# Patient Record
Sex: Female | Born: 1975 | Race: Black or African American | Hispanic: No | Smoking: Never smoker
Health system: Southern US, Community
[De-identification: ages and names within clinical notes are randomized; demographics above are authoritative.]

## PROBLEM LIST (undated history)

## (undated) DIAGNOSIS — E7800 Pure hypercholesterolemia, unspecified: Secondary | ICD-10-CM

## (undated) DIAGNOSIS — F419 Anxiety disorder, unspecified: Secondary | ICD-10-CM

## (undated) DIAGNOSIS — I10 Essential (primary) hypertension: Secondary | ICD-10-CM

## (undated) DIAGNOSIS — F32A Depression, unspecified: Secondary | ICD-10-CM

## (undated) HISTORY — PX: BOWEL RESECTION: SHX1257

## (undated) HISTORY — PX: NECK SURGERY: SHX720

## (undated) HISTORY — DX: Depression, unspecified: F32.A

---

## 2003-02-07 ENCOUNTER — Ambulatory Visit (HOSPITAL_COMMUNITY): Admission: RE | Admit: 2003-02-07 | Discharge: 2003-02-07 | Payer: Self-pay | Admitting: Neurosurgery

## 2003-03-14 ENCOUNTER — Inpatient Hospital Stay (HOSPITAL_COMMUNITY): Admission: RE | Admit: 2003-03-14 | Discharge: 2003-03-15 | Payer: Self-pay | Admitting: Neurosurgery

## 2004-03-18 ENCOUNTER — Emergency Department: Payer: Self-pay | Admitting: Internal Medicine

## 2005-03-30 ENCOUNTER — Emergency Department: Payer: Self-pay | Admitting: Emergency Medicine

## 2005-04-03 ENCOUNTER — Emergency Department: Payer: Self-pay | Admitting: Unknown Physician Specialty

## 2005-04-08 ENCOUNTER — Emergency Department: Payer: Self-pay | Admitting: Emergency Medicine

## 2005-04-25 ENCOUNTER — Emergency Department: Payer: Self-pay | Admitting: Emergency Medicine

## 2005-12-15 ENCOUNTER — Ambulatory Visit: Payer: Self-pay | Admitting: Orthopedic Surgery

## 2006-04-18 ENCOUNTER — Emergency Department: Payer: Self-pay | Admitting: Emergency Medicine

## 2006-08-21 ENCOUNTER — Emergency Department: Payer: Self-pay | Admitting: Emergency Medicine

## 2006-11-17 ENCOUNTER — Ambulatory Visit: Payer: Self-pay | Admitting: Neurological Surgery

## 2007-11-24 ENCOUNTER — Emergency Department: Payer: Self-pay | Admitting: Emergency Medicine

## 2007-11-30 ENCOUNTER — Emergency Department: Payer: Self-pay | Admitting: Emergency Medicine

## 2008-09-21 ENCOUNTER — Emergency Department: Payer: Self-pay | Admitting: Emergency Medicine

## 2008-11-21 ENCOUNTER — Ambulatory Visit: Payer: Self-pay | Admitting: Neurosurgery

## 2008-12-11 ENCOUNTER — Ambulatory Visit: Payer: Self-pay | Admitting: Family Medicine

## 2010-04-28 ENCOUNTER — Ambulatory Visit: Payer: Self-pay | Admitting: Obstetrics and Gynecology

## 2010-05-03 ENCOUNTER — Inpatient Hospital Stay: Payer: Self-pay | Admitting: Obstetrics and Gynecology

## 2011-03-29 ENCOUNTER — Emergency Department: Payer: Self-pay | Admitting: Emergency Medicine

## 2011-04-14 ENCOUNTER — Emergency Department: Payer: Self-pay | Admitting: Emergency Medicine

## 2011-04-14 LAB — URINALYSIS, COMPLETE
Glucose,UR: NEGATIVE mg/dL (ref 0–75)
Ketone: NEGATIVE
Nitrite: POSITIVE
RBC,UR: 15010 /HPF (ref 0–5)
Specific Gravity: 1.027 (ref 1.003–1.030)
Squamous Epithelial: 4
WBC UR: 565 /HPF (ref 0–5)

## 2011-05-25 ENCOUNTER — Emergency Department: Payer: Self-pay | Admitting: Emergency Medicine

## 2011-05-25 LAB — RAPID INFLUENZA A&B ANTIGENS

## 2011-05-28 LAB — BETA STREP CULTURE(ARMC)

## 2011-09-22 ENCOUNTER — Emergency Department: Payer: Self-pay | Admitting: Emergency Medicine

## 2011-09-23 LAB — COMPREHENSIVE METABOLIC PANEL
Albumin: 3.3 g/dL — ABNORMAL LOW (ref 3.4–5.0)
BUN: 13 mg/dL (ref 7–18)
Glucose: 85 mg/dL (ref 65–99)
Potassium: 3.8 mmol/L (ref 3.5–5.1)
SGOT(AST): 13 U/L — ABNORMAL LOW (ref 15–37)
SGPT (ALT): 15 U/L (ref 12–78)
Total Protein: 6.9 g/dL (ref 6.4–8.2)

## 2011-09-23 LAB — URINALYSIS, COMPLETE
Leukocyte Esterase: NEGATIVE
Nitrite: NEGATIVE
Ph: 6 (ref 4.5–8.0)
Protein: NEGATIVE
WBC UR: 1 /HPF (ref 0–5)

## 2011-09-23 LAB — CBC WITH DIFFERENTIAL/PLATELET
Basophil %: 2.2 %
Eosinophil #: 0.2 10*3/uL (ref 0.0–0.7)
HGB: 12 g/dL (ref 12.0–16.0)
Lymphocyte #: 2.2 10*3/uL (ref 1.0–3.6)
MCHC: 33 g/dL (ref 32.0–36.0)
Monocyte #: 0.5 x10 3/mm (ref 0.2–0.9)
Neutrophil #: 2 10*3/uL (ref 1.4–6.5)
Platelet: 218 10*3/uL (ref 150–440)
RBC: 4.23 10*6/uL (ref 3.80–5.20)

## 2011-09-27 ENCOUNTER — Ambulatory Visit: Payer: Self-pay | Admitting: Obstetrics and Gynecology

## 2011-09-27 LAB — BASIC METABOLIC PANEL
Anion Gap: 8 (ref 7–16)
Calcium, Total: 8.8 mg/dL (ref 8.5–10.1)
Co2: 25 mmol/L (ref 21–32)
Creatinine: 0.82 mg/dL (ref 0.60–1.30)
EGFR (African American): >60
EGFR (Non-African Amer.): >60
Glucose: 76 mg/dL (ref 65–99)
Sodium: 141 mmol/L (ref 136–145)

## 2011-10-01 ENCOUNTER — Inpatient Hospital Stay: Payer: Self-pay | Admitting: Obstetrics and Gynecology

## 2011-10-02 LAB — BASIC METABOLIC PANEL
Calcium, Total: 8.3 mg/dL — ABNORMAL LOW (ref 8.5–10.1)
Co2: 27 mmol/L (ref 21–32)
Glucose: 103 mg/dL — ABNORMAL HIGH (ref 65–99)
Osmolality: 277 (ref 275–301)
Potassium: 3.7 mmol/L (ref 3.5–5.1)
Sodium: 140 mmol/L (ref 136–145)

## 2011-10-03 LAB — CBC WITH DIFFERENTIAL/PLATELET
Basophil %: 0.4 %
Eosinophil #: 0.4 10*3/uL (ref 0.0–0.7)
Eosinophil %: 6.8 %
HCT: 30.5 % — ABNORMAL LOW (ref 35.0–47.0)
HGB: 10.2 g/dL — ABNORMAL LOW (ref 12.0–16.0)
Lymphocyte #: 1.1 10*3/uL (ref 1.0–3.6)
MCH: 28.6 pg (ref 26.0–34.0)
MCHC: 33.5 g/dL (ref 32.0–36.0)
MCV: 85 fL (ref 80–100)
Monocyte #: 0.6 x10 3/mm (ref 0.2–0.9)
Neutrophil #: 3.3 10*3/uL (ref 1.4–6.5)
RBC: 3.58 10*6/uL — ABNORMAL LOW (ref 3.80–5.20)

## 2011-10-03 LAB — URINALYSIS, COMPLETE
Glucose,UR: NEGATIVE mg/dL (ref 0–75)
Leukocyte Esterase: NEGATIVE
Nitrite: NEGATIVE
Ph: 8 (ref 4.5–8.0)
Protein: NEGATIVE
Specific Gravity: 1.005 (ref 1.003–1.030)

## 2011-10-04 LAB — CBC WITH DIFFERENTIAL/PLATELET
Basophil #: 0 10*3/uL (ref 0.0–0.1)
Basophil %: 0.4 %
Eosinophil #: 0.5 10*3/uL (ref 0.0–0.7)
HCT: 30.3 % — ABNORMAL LOW (ref 35.0–47.0)
HGB: 9.9 g/dL — ABNORMAL LOW (ref 12.0–16.0)
Lymphocyte %: 27.4 %
MCH: 28 pg (ref 26.0–34.0)
MCHC: 32.6 g/dL (ref 32.0–36.0)
Monocyte #: 0.6 x10 3/mm (ref 0.2–0.9)
Neutrophil #: 2.6 10*3/uL (ref 1.4–6.5)
Neutrophil %: 50.6 %
RDW: 13.6 % (ref 11.5–14.5)

## 2011-10-05 LAB — CBC WITH DIFFERENTIAL/PLATELET
Basophil #: 0 10*3/uL (ref 0.0–0.1)
Basophil %: 0.6 %
HGB: 9.8 g/dL — ABNORMAL LOW (ref 12.0–16.0)
Lymphocyte %: 31 %
MCH: 27.7 pg (ref 26.0–34.0)
MCHC: 32.2 g/dL (ref 32.0–36.0)
Monocyte %: 11.4 %
Neutrophil #: 2.1 10*3/uL (ref 1.4–6.5)
Neutrophil %: 43.8 %

## 2012-10-16 ENCOUNTER — Emergency Department: Payer: Self-pay | Admitting: Emergency Medicine

## 2013-04-03 ENCOUNTER — Emergency Department: Payer: Self-pay | Admitting: Emergency Medicine

## 2013-04-06 LAB — BETA STREP CULTURE(ARMC)

## 2013-09-08 ENCOUNTER — Emergency Department: Payer: Self-pay | Admitting: Emergency Medicine

## 2013-09-08 LAB — COMPREHENSIVE METABOLIC PANEL
ALT: 26 U/L
ANION GAP: 8 (ref 7–16)
Albumin: 3.5 g/dL (ref 3.4–5.0)
Alkaline Phosphatase: 29 U/L — ABNORMAL LOW
BILIRUBIN TOTAL: 0.3 mg/dL (ref 0.2–1.0)
BUN: 10 mg/dL (ref 7–18)
CALCIUM: 9 mg/dL (ref 8.5–10.1)
CO2: 25 mmol/L (ref 21–32)
Chloride: 106 mmol/L (ref 98–107)
Creatinine: 0.78 mg/dL (ref 0.60–1.30)
EGFR (African American): >60
Glucose: 91 mg/dL (ref 65–99)
Osmolality: 276 (ref 275–301)
Potassium: 3.9 mmol/L (ref 3.5–5.1)
SGOT(AST): 14 U/L — ABNORMAL LOW (ref 15–37)
SODIUM: 139 mmol/L (ref 136–145)
Total Protein: 7.3 g/dL (ref 6.4–8.2)

## 2013-09-08 LAB — CBC WITH DIFFERENTIAL/PLATELET
Basophil #: 0 10*3/uL (ref 0.0–0.1)
Basophil %: 0.5 %
EOS ABS: 0.3 10*3/uL (ref 0.0–0.7)
EOS PCT: 4.6 %
HCT: 37.5 % (ref 35.0–47.0)
HGB: 12.2 g/dL (ref 12.0–16.0)
Lymphocyte #: 1.9 10*3/uL (ref 1.0–3.6)
Lymphocyte %: 34.6 %
MCH: 28.1 pg (ref 26.0–34.0)
MCHC: 32.5 g/dL (ref 32.0–36.0)
MCV: 86 fL (ref 80–100)
MONO ABS: 0.5 x10 3/mm (ref 0.2–0.9)
Monocyte %: 8.5 %
Neutrophil #: 2.8 10*3/uL (ref 1.4–6.5)
Neutrophil %: 51.8 %
PLATELETS: 228 10*3/uL (ref 150–440)
RBC: 4.35 10*6/uL (ref 3.80–5.20)
RDW: 13.2 % (ref 11.5–14.5)
WBC: 5.4 10*3/uL (ref 3.6–11.0)

## 2013-09-08 LAB — URINALYSIS, COMPLETE
Bacteria: NONE SEEN
Bilirubin,UR: NEGATIVE
Blood: NEGATIVE
GLUCOSE, UR: NEGATIVE mg/dL (ref 0–75)
KETONE: NEGATIVE
LEUKOCYTE ESTERASE: NEGATIVE
Nitrite: POSITIVE
Ph: 6 (ref 4.5–8.0)
Protein: NEGATIVE
RBC,UR: 1 /HPF (ref 0–5)
Specific Gravity: 1.015 (ref 1.003–1.030)
WBC UR: 4 /HPF (ref 0–5)

## 2013-09-08 LAB — LIPASE, BLOOD: LIPASE: 96 U/L (ref 73–393)

## 2014-04-30 NOTE — Discharge Summary (Signed)
PATIENT NAME:  Lacey King, Lacey King MR#:  [id number] DATE OF BIRTH:  February 27, 1975  DATE OF ADMISSION:  10/01/2011 DATE OF DISCHARGE:  10/05/2011  PRINCIPLE PROCEDURE: Exploratory laparotomy, lysis of adhesions.   HOSPITAL COURSE: The patient underwent the above procedure without complication. Postoperatively the patient had slow return of bowel function. Postop day #3 she was advanced to a regular diet. The patient had intermittent fevers postoperatively initially thought to be due to atelectasis. She was placed on gentamicin and Cleocin and remained afebrile for the last 36 hours before discharge. The patient was passing flatus. Incision was clean, dry, and intact. Vital signs stable on discharge. She was discharged to home in good condition. She will follow-up with Dr. Feliberto GottronSchermerhorn in two weeks or before if she has wound drainage, fever, nausea, vomiting, or increasing abdominal pain. Staples were removed before discharge and Steri-Strips applied.   DISCHARGE MEDICATIONS:  1. Percocet 5/325 mg 1 to 2 tablets every 4 to 6 hours. 2. Naprosyn 500 mg q.12 hours as needed for pain.  3. Mylicon 80 mg q.a.c. and at bedtime. 4. Colace 100 mg 1 tablet daily.   ____________________________ Suzy Bouchardhomas J. Broden Holt, MD tjs:drc D: 10/05/2011 09:10:55 ET T: 10/06/2011 16:25:43 ET JOB#: [id number]  cc: Suzy Bouchardhomas J. Shaunee Mulkern, MD, <Dictator> Suzy BouchardHOMAS J Tasmin Exantus MD ELECTRONICALLY SIGNED 10/09/2011 9:42

## 2014-04-30 NOTE — Op Note (Signed)
PATIENT NAME:  [name], Lacey King#:  [id number] DATE OF BIRTH:  [birth year]  DATE OF PROCEDURE:  10/01/2011  PREOPERATIVE DIAGNOSIS: Pelvic fluid-filled mass with associated pain.   POSTOPERATIVE DIAGNOSIS: Pelvic fluid collection with extensive intra-abdominal adhesions.   PROCEDURE: Enterolysis.   SURGEON:  Adella HareJ. Wilton Muhammad Vacca, MD.   ANESTHESIA: General.   INDICATIONS: This 39 year old female with previous hysterectomy was admitted with pelvic pain and CT findings of a fluid-filled pelvic mass. The patient was undergoing surgery by Dr. Feliberto GottronSchermerhorn and Dr. Logan BoresEvans. There were multiple intra-abdominal adhesions found and what appeared to be a large cystic structure, however, the sigmoid colon was attached to this and also a portion of the small bowel. Dr. Feliberto GottronSchermerhorn called for a general surgery consultation to do some of the dissection to mobilize the colon and small bowel off the cystic mass to allow better exploration of the pelvis.   DESCRIPTION OF PROCEDURE:  The abdomen was opened and I identified the large fluid collection which at the time it appeared that it may be a large cyst and there was concern about possibility of a neoplastic cyst. The sigmoid colon was attached to this and the colon itself was soft and pliable with no apparent mass in this portion of the colon. I then lysed multiple adhesions between the wall of the fluid collection and the sigmoid colon mobilizing a portion of the sigmoid colon which was approximately 6 inches in length off of this fluid-filled collection of tissue. Also, mobilized a portion of the ileum off of it with additional lysis of adhesions and separated the small bowel from the right ovary. The appendix was in the operative field and appeared normal. Further dissection was carried out as the wall of the fluid collection was peeled away from the mesentery of the sigmoid colon. This allowed further exposure. It is noted that the fluid collection then drained  and suction catheter was put into it and aspirated a large amount of fluid. This exposed the tubes and ovaries for Dr. Feliberto GottronSchermerhorn to examine.  Seeing that the sigmoid colon had been mobilized and ileum mobilized by lysis of adhesions, I scrubbed out of the operation for Dr. Feliberto GottronSchermerhorn and Dr. Logan BoresEvans to continue with pelvic surgery.  ____________________________ Shela CommonsJ. Renda RollsWilton Rayen Palen, MD jws:ap D: 10/01/2011 17:19:28 ET T: 10/02/2011 10:22:36 ET JOB#: [id number]  cc: Adella HareJ. Wilton Marquerite Forsman, MD, <Dictator> Adella HareWILTON J Alina Gilkey MD ELECTRONICALLY SIGNED 10/08/2011 9:45

## 2014-04-30 NOTE — Op Note (Signed)
PATIENT NAME:  Lacey King, [name] MR#:  [id number] DATE OF BIRTH:  Jan 30, 1975  DATE OF PROCEDURE:  10/01/2011  PREOPERATIVE DIAGNOSIS: Large left pelvic mass.    POSTOPERATIVE DIAGNOSES:  1. Peritoneal collection of fluid.  2. Extensive pelvic adhesions.   PROCEDURES:  1. Exploratory laparotomy. 2. Extensive adhesiolysis.  3. Intraoperative consultation, Dr. Renda RollsWilton Smith.  4. Retrograde bladder filling.   ANESTHESIA: General endotracheal anesthesia.   SURGEON: Suzy Bouchardhomas J. Breck [place], MD  FIRST ASSISTANT: Dr. Logan BoresEvans   INTRAOPERATIVE CONSULTANT: Dr. Renda RollsWilton Smith.   INDICATIONS: A 39 year old gravida 3, para 2 patient with a 14 x 12 cm left adnexal mass that was noted on CT scan and ultrasound. Patient is status post a total abdominal hysterectomy 14 months prior.   DESCRIPTION OF PROCEDURE: After adequate general endotracheal anesthesia, patient was placed in dorsal supine position. Abdomen, perineal, vagina was prepped with normal sterile fashion. A Foley catheter previously placed. 1 gram of IV Ancef used for prophylaxis. Pfannenstiel incision was made. Sharp dissection was used to identify the fascia. Fascia was opened in the midline and opened in a transverse fashion. There were some dense adhesions at the fascial and subfascial level. Ultimately peritoneal cavity was entered sharply. A large amount of omental adhesions were dissected free clamping, transecting, and tying with Vicryl. Ultimately, an O'Connor-O'Sullivan retractor was advanced into the abdominal cavity and the bowel was packed cephalad. A large amount of pelvic adhesions with bowel adhesed to the vaginal vault and sidewalls. Dissection took place. Ultimately the mass in question was identified and felt to be retroperitoneal with the sigmoid draped on top of this. At this point intraoperative consultation was requested with Dr. Renda RollsWilton Smith. Please reference Dr. Murriel HopperWilton Smith's separate operative report. Once Dr. Katrinka BlazingSmith came  in he scrubbed. The sigmoid colon was meticulously dissected off this large left lower mass. During the dissection of the sigmoid the cyst wall was entered. Straw-colored fluid resulted. The collection was drained. This loculation was opened and felt that there was no definitive mass other than just a loculation of peritoneal fluid. The left ovary and fallopian tube were on the left sidewall and appeared normal. There was some scarring to the left sidewall but there was no nodularity or papulations within this pseudocyst wall. Again, Dr. Renda RollsWilton Smith continued the dissection to free the sigmoid up completely and the small bowel that was adhesed to the right ovary and deep into the pelvis. Ultimately all tissues were freed. Given the dissection being close to the bladder, the bladder was retrograde filled with indigo carmine and saline. There was no leakage of the bladder. The foley was replaced.The patient's abdomen was then copiously irrigated. Good hemostasis was noted. The fascia was closed with 0 Vicryl suture in a running nonlocking fashion, good approximation of edgesThe subcutaneous tissues were irrigated and bovied for hemostasis and the skin was reapproximated with staples. Estimated blood loss 150 mL. Intraoperative fluids 1900 mL. Patient tolerated procedure well and was taken to recovery room in good condition.  ____________________________ Suzy Bouchardhomas J. Ran Tullis, MD tjs:cms D: 10/01/2011 13:00:46 ET T: 10/01/2011 13:32:39 ET JOB#: [id number]  cc: Suzy Bouchardhomas J. Eldrige Pitkin, MD, <Dictator> Suzy BouchardHOMAS J Broadus Costilla MD ELECTRONICALLY SIGNED 10/02/2011 10:12

## 2014-07-04 ENCOUNTER — Encounter: Payer: Self-pay | Admitting: *Deleted

## 2014-07-04 ENCOUNTER — Emergency Department
Admission: EM | Admit: 2014-07-04 | Discharge: 2014-07-04 | Disposition: A | Discharge status: Home / Self Care | Payer: Self-pay | Attending: Student | Admitting: Student

## 2014-07-04 ENCOUNTER — Other Ambulatory Visit: Payer: Self-pay

## 2014-07-04 DIAGNOSIS — I10 Essential (primary) hypertension: Secondary | ICD-10-CM

## 2014-07-04 DIAGNOSIS — Z79899 Other long term (current) drug therapy: Secondary | ICD-10-CM | POA: Insufficient documentation

## 2014-07-04 DIAGNOSIS — M542 Cervicalgia: Secondary | ICD-10-CM

## 2014-07-04 DIAGNOSIS — G8929 Other chronic pain: Secondary | ICD-10-CM | POA: Insufficient documentation

## 2014-07-04 HISTORY — DX: Essential (primary) hypertension: I10

## 2014-07-04 LAB — URINALYSIS COMPLETE WITH MICROSCOPIC (ARMC ONLY)
BILIRUBIN URINE: NEGATIVE
Bacteria, UA: NONE SEEN
Glucose, UA: NEGATIVE mg/dL
KETONES UR: NEGATIVE mg/dL
LEUKOCYTES UA: NEGATIVE
Nitrite: NEGATIVE
PH: 5 (ref 5.0–8.0)
PROTEIN: NEGATIVE mg/dL
Specific Gravity, Urine: 1.017 (ref 1.005–1.030)

## 2014-07-04 MED ORDER — KETOROLAC TROMETHAMINE 10 MG PO TABS: 10.0000 mg | ORAL_TABLET | Freq: Four times a day (QID) | ORAL | Status: DC | PRN

## 2014-07-04 MED ORDER — DIAZEPAM 5 MG PO TABS
ORAL_TABLET | ORAL | Status: AC
  Administered 2014-07-04: 2 mg via ORAL
  Filled 2014-07-04: qty 1

## 2014-07-04 MED ORDER — LISINOPRIL 20 MG PO TABS
20.0000 mg | ORAL_TABLET | Freq: Every day | ORAL | Status: AC
Start: 2014-07-04 — End: 2015-07-04

## 2014-07-04 MED ORDER — DIAZEPAM 2 MG PO TABS
2.0000 mg | ORAL_TABLET | Freq: Three times a day (TID) | ORAL | Status: AC | PRN
Start: 2014-07-04 — End: 2015-07-04

## 2014-07-04 MED ORDER — KETOROLAC TROMETHAMINE 60 MG/2ML IM SOLN
INTRAMUSCULAR | Status: AC
  Administered 2014-07-04: 60 mg via INTRAMUSCULAR
  Filled 2014-07-04: qty 2

## 2014-07-04 MED ORDER — CLONIDINE HCL 0.1 MG PO TABS
0.2000 mg | ORAL_TABLET | Freq: Once | ORAL | Status: AC
  Administered 2014-07-04: 0.2 mg via ORAL

## 2014-07-04 MED ORDER — DIAZEPAM 2 MG PO TABS
2.0000 mg | ORAL_TABLET | Freq: Once | ORAL | Status: AC
  Administered 2014-07-04: 2 mg via ORAL

## 2014-07-04 MED ORDER — CLONIDINE HCL 0.1 MG PO TABS
ORAL_TABLET | ORAL | Status: AC
  Administered 2014-07-04: 0.2 mg via ORAL
  Filled 2014-07-04: qty 2

## 2014-07-04 MED ORDER — KETOROLAC TROMETHAMINE 60 MG/2ML IM SOLN
60.0000 mg | Freq: Once | INTRAMUSCULAR | Status: AC
  Administered 2014-07-04: 60 mg via INTRAMUSCULAR
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
## 2014-07-04 NOTE — ED Notes (Signed)
Computer not working in room 46 where pt was. E-signature not done. Pt verbalized understanding of prescriptions and d/c instructions and had no further questions, needs, or concerns.

## 2014-07-04 NOTE — ED Notes (Signed)
Pt arrives with complaints of neck pain, pt has hx of neck surgery and pt states for the past week neck feels very sore

## 2014-07-04 NOTE — Discharge Instructions (Signed)
Cervical Sprain A cervical sprain is when the tissues (ligaments) that hold the neck bones in place stretch or tear. HOME CARE   Put ice on the injured area.  Put ice in a plastic bag.  Place a towel between your skin and the bag.  Leave the ice on for 15-20 minutes, 3-4 times a day.  You may have been given a collar to wear. This collar keeps your neck from moving while you heal.  Do not take the collar off unless told by your doctor.  If you have long hair, keep it outside of the collar.  Ask your doctor before changing the position of your collar. You may need to change its position over time to make it more comfortable.  If you are allowed to take off the collar for cleaning or bathing, follow your doctor's instructions on how to do it safely.  Keep your collar clean by wiping it with mild soap and water. Dry it completely. If the collar has removable pads, remove them every 1-2 days to hand wash them with soap and water. Allow them to air dry. They should be dry before you wear them in the collar.  Do not drive while wearing the collar.  Only take medicine as told by your doctor.  Keep all doctor visits as told.  Keep all physical therapy visits as told.  Adjust your work station so that you have good posture while you work.  Avoid positions and activities that make your problems worse.  Warm up and stretch before being active. GET HELP IF:  Your pain is not controlled with medicine.  You cannot take less pain medicine over time as planned.  Your activity level does not improve as expected. GET HELP RIGHT AWAY IF:   You are bleeding.  Your stomach is upset.  You have an allergic reaction to your medicine.  You develop new problems that you cannot explain.  You lose feeling (become numb) or you cannot move any part of your body (paralysis).  You have tingling or weakness in any part of your body.  Your symptoms get worse. Symptoms include:  Pain,  soreness, stiffness, puffiness (swelling), or a burning feeling in your neck.  Pain when your neck is touched.  Shoulder or upper back pain.  Limited ability to move your neck.  Headache.  Dizziness.  Your hands or arms feel week, lose feeling, or tingle.  Muscle spasms.  Difficulty swallowing or chewing. MAKE SURE YOU:   Understand these instructions.  Will watch your condition.  Will get help right away if you are not doing well or get worse. Document Released: 06/16/2007 Document Revised: 08/30/2012 Document Reviewed: 07/05/2012 [employer] Patient Information 2015 [employer]. This information is not intended to replace advice given to you by your health care provider. Make sure you discuss any questions you have with your health care provider.  

## 2014-07-04 NOTE — ED Provider Notes (Signed)
-----------------------------------------   7:54 PM on 07/04/2014 -----------------------------------------  ED ECG REPORT I, Gayla Doss, the attending physician, personally viewed and interpreted this ECG.   Date: 07/04/2014  EKG Time: 16:10  Rate: 69  Rhythm: normal sinus rhythm  Axis: Normal  Intervals:normal;  minimal voltage criteria for LVH may be normal variant  ST&T Change: No acute ST segment elevation   Gayla Doss, MD 07/04/14 1955

## 2014-07-04 NOTE — ED Notes (Addendum)
Pt reports neck pain for the last several days, pt denies any other symptoms, pt reports having surgery on neck 2005, pt has a history of hypertension, got laid off and has not taken blood pressure medications since Friday, pt denies dizziness

## 2015-09-09 ENCOUNTER — Emergency Department: Payer: Managed Care, Other (non HMO)

## 2015-09-09 ENCOUNTER — Encounter: Payer: Self-pay | Admitting: Emergency Medicine

## 2015-09-09 ENCOUNTER — Emergency Department
Admission: EM | Admit: 2015-09-09 | Discharge: 2015-09-09 | Disposition: A | Discharge status: Home / Self Care | Payer: Managed Care, Other (non HMO) | Attending: Emergency Medicine | Admitting: Emergency Medicine

## 2015-09-09 DIAGNOSIS — M545 Low back pain: Secondary | ICD-10-CM | POA: Insufficient documentation

## 2015-09-09 DIAGNOSIS — I10 Essential (primary) hypertension: Secondary | ICD-10-CM | POA: Diagnosis not present

## 2015-09-09 DIAGNOSIS — K2970 Gastritis, unspecified, without bleeding: Secondary | ICD-10-CM | POA: Diagnosis not present

## 2015-09-09 DIAGNOSIS — G8929 Other chronic pain: Secondary | ICD-10-CM | POA: Diagnosis not present

## 2015-09-09 DIAGNOSIS — R112 Nausea with vomiting, unspecified: Secondary | ICD-10-CM | POA: Diagnosis present

## 2015-09-09 DIAGNOSIS — R1013 Epigastric pain: Secondary | ICD-10-CM

## 2015-09-09 LAB — URINALYSIS COMPLETE WITH MICROSCOPIC (ARMC ONLY)
BACTERIA UA: NONE SEEN
Bilirubin Urine: NEGATIVE
Glucose, UA: NEGATIVE mg/dL
Leukocytes, UA: NEGATIVE
NITRITE: NEGATIVE
PROTEIN: 100 mg/dL — AB
SPECIFIC GRAVITY, URINE: 1.018 (ref 1.005–1.030)
pH: 6 (ref 5.0–8.0)

## 2015-09-09 LAB — CBC
HEMATOCRIT: 36.7 % (ref 35.0–47.0)
HEMOGLOBIN: 12.5 g/dL (ref 12.0–16.0)
MCH: 29.1 pg (ref 26.0–34.0)
MCHC: 34 g/dL (ref 32.0–36.0)
MCV: 85.8 fL (ref 80.0–100.0)
Platelets: 248 10*3/uL (ref 150–440)
RBC: 4.28 MIL/uL (ref 3.80–5.20)
RDW: 16 % — ABNORMAL HIGH (ref 11.5–14.5)
WBC: 5.2 10*3/uL (ref 3.6–11.0)

## 2015-09-09 LAB — COMPREHENSIVE METABOLIC PANEL
ALBUMIN: 4 g/dL (ref 3.5–5.0)
ALK PHOS: 40 U/L (ref 38–126)
ALT: 104 U/L — AB (ref 14–54)
ANION GAP: 12 (ref 5–15)
AST: 85 U/L — AB (ref 15–41)
BILIRUBIN TOTAL: 1.3 mg/dL — AB (ref 0.3–1.2)
BUN: 8 mg/dL (ref 6–20)
CHLORIDE: 102 mmol/L (ref 101–111)
CO2: 23 mmol/L (ref 22–32)
Calcium: 8.6 mg/dL — ABNORMAL LOW (ref 8.9–10.3)
Creatinine, Ser: 0.82 mg/dL (ref 0.44–1.00)
GFR calc Af Amer: >60 mL/min (ref >60)
GLUCOSE: 79 mg/dL (ref 65–99)
POTASSIUM: 3 mmol/L — AB (ref 3.5–5.1)
Sodium: 137 mmol/L (ref 135–145)
TOTAL PROTEIN: 7.9 g/dL (ref 6.5–8.1)

## 2015-09-09 LAB — LIPASE, BLOOD: Lipase: 20 U/L (ref 11–51)

## 2015-09-09 MED ORDER — RANITIDINE HCL 150 MG PO TABS: 150.0000 mg | ORAL_TABLET | Freq: Two times a day (BID) | ORAL | 1 refills | Status: DC

## 2015-09-09 MED ORDER — SUCRALFATE 1 G PO TABS: 1.0000 g | ORAL_TABLET | Freq: Four times a day (QID) | ORAL | 0 refills | Status: DC

## 2015-09-09 MED ORDER — ONDANSETRON HCL 4 MG PO TABS: 4.0000 mg | ORAL_TABLET | Freq: Three times a day (TID) | ORAL | 0 refills | Status: DC | PRN

## 2015-09-09 MED ORDER — GI COCKTAIL ~~LOC~~
30.0000 mL | Freq: Once | ORAL | Status: AC
  Administered 2015-09-09: 30 mL via ORAL
  Filled 2015-09-09: qty 30

## 2015-09-09 NOTE — ED Notes (Signed)
Pt reports that she took her AM blood pressure meds, but vomited shortly thereafter.  Pt states that she didn't want to accidentally overdose on the medication so she did not repeat dose at that time.

## 2015-09-09 NOTE — ED Notes (Signed)
EDP notfiied of BP.  Pt states that she takes furosemide at night and has losartan and furosemide to take in AM.  EDP okay with this.

## 2015-09-09 NOTE — ED Notes (Signed)
Pt reports nausea and vomiting x2-3 weeks.  States she has no diarrhea.  Pt states that she has not been eating anything recently because any time that she eats she throws up.  Pt states she can tolerate "ice with some salt on it".  Pt appears in NAD and alert & oriented at this time.

## 2015-09-09 NOTE — ED Provider Notes (Signed)
[facility] Emergency Department Provider Note   ____________________________________________   I have reviewed the triage vital signs and the nursing notes.   HISTORY  Chief Complaint Emesis and Nausea   History limited by: Not Limited   HPI Lacey King is a 40 y.o. female who presents to the emergency department today because of concerns for nausea and vomiting. It is been going on for the past 2 weeks. It occurs whenever the patient tries to eat or drink anything. She states she has not been able to keep anything down for the past 2 weeks. She has been having some associated epigastric pain with this. There is mild. She denies any fevers. Denies any bloody stool. Denies any change in urination. No weight loss. Denies similar symptoms in the past.   Past Medical History:  Diagnosis Date  . Hypertension     There are no active problems to display for this patient.   Past Surgical History:  Procedure Laterality Date  . BOWEL RESECTION      Prior to Admission medications   Medication Sig Start Date End Date Taking? Authorizing Provider  ketorolac (TORADOL) 10 MG tablet Take 1 tablet (10 mg total) by mouth every 6 (six) hours as needed. 07/04/14   Chinita Pesterari B Triplett, FNP  lisinopril (PRINIVIL,ZESTRIL) 20 MG tablet Take 1 tablet (20 mg total) by mouth daily. 07/04/14 07/04/15  Chinita Pesterari B Triplett, FNP    Allergies Percocet [oxycodone-acetaminophen]  No family history on file.  Social History Social History  Substance Use Topics  . Smoking status: Never Smoker  . Smokeless tobacco: Never Used  . Alcohol use No    Review of Systems  Constitutional: Negative for fever. Cardiovascular: Negative for chest pain. Respiratory: Negative for shortness of breath. Gastrointestinal: Positive for abdominal pain, nausea and vomiting.  Genitourinary: Negative for dysuria. Musculoskeletal: Positive for chronic low back pain. Neurological: Negative  for headaches, focal weakness or numbness.  10-point ROS otherwise negative.  ____________________________________________   PHYSICAL EXAM:  VITAL SIGNS: ED Triage Vitals  Enc Vitals Group     BP 09/09/15 1555 (!) 190/136     Pulse Rate 09/09/15 1555 95     Resp 09/09/15 1555 17     Temp 09/09/15 1555 98.2 F (36.8 C)     Temp Source 09/09/15 1555 Oral     SpO2 09/09/15 1555 98 %     Weight 09/09/15 1557 200 lb (90.7 kg)     Height 09/09/15 1557 5\' 3"  (1.6 m)     Head Circumference --      Peak Flow --      Pain Score 09/09/15 1612 7   Constitutional: Alert and oriented. Well appearing and in no distress. Eyes: Conjunctivae are normal. Normal extraocular movements. ENT   Head: Normocephalic and atraumatic.   Nose: No congestion/rhinnorhea.   Mouth/Throat: Mucous membranes are moist.   Neck: No stridor. Hematological/Lymphatic/Immunilogical: No cervical lymphadenopathy. Cardiovascular: Normal rate, regular rhythm.  No murmurs, rubs, or gallops. Respiratory: Normal respiratory effort without tachypnea nor retractions. Breath sounds are clear and equal bilaterally. No wheezes/rales/rhonchi. Gastrointestinal: Soft and mildly tender to palpation in the epigastric region. No rebound. No guarding.  Genitourinary: Deferred Musculoskeletal: Normal range of motion in all extremities. No lower extremity edema. Neurologic:  Normal speech and language. No gross focal neurologic deficits are appreciated.  Skin:  Skin is warm, dry and intact. No rash noted. Psychiatric: Mood and affect are normal. Speech and behavior are normal. Patient exhibits  appropriate insight and judgment.  ____________________________________________    LABS (pertinent positives/negatives)  Labs Reviewed  COMPREHENSIVE METABOLIC PANEL - Abnormal; Notable for the following:       Result Value   Potassium 3.0 (*)    Calcium 8.6 (*)    AST 85 (*)    ALT 104 (*)    Total Bilirubin 1.3 (*)    All  other components within normal limits  CBC - Abnormal; Notable for the following:    RDW 16.0 (*)    All other components within normal limits  URINALYSIS COMPLETEWITH MICROSCOPIC ([facility] ONLY) - Abnormal; Notable for the following:    Color, Urine YELLOW (*)    APPearance CLEAR (*)    Ketones, ur 2+ (*)    Hgb urine dipstick 2+ (*)    Protein, ur 100 (*)    Squamous Epithelial / LPF 0-5 (*)    All other components within normal limits  LIPASE, BLOOD     ____________________________________________   EKG  None  ____________________________________________    RADIOLOGY  RUQ US IMPRESSION:  No acute abnormality is identified. Probable liver steatosis.      ____________________________________________   PROCEDURES  Procedures  ____________________________________________   INITIAL IMPRESSION / ASSESSMENT AND PLAN / ED COURSE  Pertinent labs & imaging results that were available during my care of the patient were reviewed by me and considered in my medical decision making (see chart for details).  Patient presents because of concern for nausea and vomiting with eating. On exam patient is mildly tender to palpation in the epigastric region. I would have some concern for possible gallbladder disease. Will plan on obtaining RUQ [place].  Clinical Course   RUQ [place] without any concerning findings. Patient stated she felt much better after GI cocktail and she was able to hold down crackers. Will plan on discharging with medication for likely gastritis. ____________________________________________   FINAL CLINICAL IMPRESSION(S) / ED DIAGNOSES  Final diagnoses:  Epigastric pain  Gastritis     Note: This dictation was prepared with Dragon dictation. Any transcriptional errors that result from this process are unintentional    Phineas Semen, MD 09/09/15 2217

## 2015-09-09 NOTE — Discharge Instructions (Signed)
Please seek medical attention for any high fevers, chest pain, shortness of breath, change in behavior, persistent vomiting, bloody stool or any other new or concerning symptoms.  

## 2015-09-09 NOTE — ED Notes (Signed)
Pt discharged to home.  Family member driving.  Discharge instructions reviewed.  Verbalized understanding.  No questions or concerns at this time.  Teach back verified.  Pt in NAD.  No items left in ED.   

## 2015-09-09 NOTE — ED Triage Notes (Signed)
Vomiting x 2 weeks.  C/O bilateral flank pain and constant nausea.

## 2016-04-26 ENCOUNTER — Encounter: Payer: Self-pay | Admitting: Emergency Medicine

## 2016-04-26 ENCOUNTER — Emergency Department: Payer: Commercial Managed Care - PPO

## 2016-04-26 ENCOUNTER — Emergency Department
Admission: EM | Admit: 2016-04-26 | Discharge: 2016-04-26 | Disposition: A | Discharge status: Home / Self Care | Payer: Commercial Managed Care - PPO | Attending: Emergency Medicine | Admitting: Emergency Medicine

## 2016-04-26 DIAGNOSIS — M5412 Radiculopathy, cervical region: Secondary | ICD-10-CM

## 2016-04-26 DIAGNOSIS — I10 Essential (primary) hypertension: Secondary | ICD-10-CM | POA: Insufficient documentation

## 2016-04-26 DIAGNOSIS — M542 Cervicalgia: Secondary | ICD-10-CM | POA: Diagnosis present

## 2016-04-26 DIAGNOSIS — G8929 Other chronic pain: Secondary | ICD-10-CM | POA: Diagnosis not present

## 2016-04-26 DIAGNOSIS — M545 Low back pain: Secondary | ICD-10-CM | POA: Diagnosis not present

## 2016-04-26 DIAGNOSIS — Z79899 Other long term (current) drug therapy: Secondary | ICD-10-CM | POA: Diagnosis not present

## 2016-04-26 MED ORDER — DIAZEPAM 5 MG PO TABS: 5.0000 mg | ORAL_TABLET | Freq: Three times a day (TID) | ORAL | 0 refills | Status: AC | PRN

## 2016-04-26 MED ORDER — LIDOCAINE 5 % EX PTCH
1.0000 | MEDICATED_PATCH | CUTANEOUS | Status: DC
  Administered 2016-04-26: 1 via TRANSDERMAL
  Filled 2016-04-26: qty 1

## 2016-04-26 MED ORDER — KETOROLAC TROMETHAMINE 60 MG/2ML IM SOLN
60.0000 mg | Freq: Once | INTRAMUSCULAR | Status: DC
  Filled 2016-04-26: qty 2

## 2016-04-26 MED ORDER — KETOROLAC TROMETHAMINE 30 MG/ML IJ SOLN
30.0000 mg | Freq: Once | INTRAMUSCULAR | Status: AC
  Administered 2016-04-26: 30 mg via INTRAVENOUS

## 2016-04-26 MED ORDER — LIDOCAINE 5 % EX PTCH
1.0000 | MEDICATED_PATCH | Freq: Two times a day (BID) | CUTANEOUS | 0 refills | Status: AC
Start: 2016-04-26 — End: 2017-04-26

## 2016-04-26 MED ORDER — TRAMADOL HCL 50 MG PO TABS
50.0000 mg | ORAL_TABLET | Freq: Once | ORAL | Status: AC
  Administered 2016-04-26: 50 mg via ORAL
  Filled 2016-04-26: qty 1

## 2016-04-26 MED ORDER — ETODOLAC 200 MG PO CAPS: 200.0000 mg | ORAL_CAPSULE | Freq: Three times a day (TID) | ORAL | 0 refills | Status: DC

## 2016-04-26 MED ORDER — DIAZEPAM 2 MG PO TABS
2.0000 mg | ORAL_TABLET | Freq: Once | ORAL | Status: AC
  Administered 2016-04-26: 2 mg via ORAL
  Filled 2016-04-26: qty 1

## 2016-04-26 MED ORDER — HYDROCODONE-ACETAMINOPHEN 5-325 MG PO TABS
1.0000 | ORAL_TABLET | Freq: Once | ORAL | Status: AC
  Administered 2016-04-26: 1 via ORAL
  Filled 2016-04-26: qty 1

## 2016-04-26 NOTE — ED Triage Notes (Signed)
Pt. States she was having a family outing today at home and was drinking alcohol.  Pt. States hx of back problems.  Pt. States tonight irritated back and was unable to control pain.  Pt. States pain to back of neck and low back pain.

## 2016-04-26 NOTE — ED Notes (Addendum)
Pt. States she was at a family outing tonight and reaggregated back while moving around.  Pt. Denies falling.  Pt. Stated low back pain, neck pain and HA.  ETOH on board.  Pt. States she is prescribed pain medication for back, but it gave no relief tonight.

## 2016-04-26 NOTE — ED Provider Notes (Signed)
[facility] Emergency Department Provider Note   ____________________________________________   First MD Initiated Contact with Patient 04/26/16 [phone]     (approximate)  I have reviewed the triage vital signs and the nursing notes.   HISTORY  Chief Complaint Back Pain and Neck Pain    HPI Lacey King is a 41 y.o. female who comes into the hospital today with some neck and back pain. She reports that her back just went out. She has a history of herniated disks in her lower back and she reports it goes out every now and then. She reports it is been hurting for a while but could not quantify exactly how long that was. She also reports that she can't raise her right arm because of pain. She reports that the pain shoots into the back of her head. She denies doing anything to aggravate it specifically. She denies any falls. She took some ibuprofen at home as well as tizanidine which she is prescribed. She reports that her pain is a 20 out of 10 in intensity. She reports her pain is that high every day but it's just worse because she can't move without significant discomfort. Patient is here today for evaluation. She has no difficulty with urination or her bowel movements. The patient has no numbness or tingling and no weakness.   Past Medical History:  Diagnosis Date  . Hypertension     There are no active problems to display for this patient.   Past Surgical History:  Procedure Laterality Date  . BOWEL RESECTION      Prior to Admission medications   Medication Sig Start Date End Date Taking? Authorizing Provider  diazepam (VALIUM) 5 MG tablet Take 1 tablet (5 mg total) by mouth every 8 (eight) hours as needed for anxiety. 04/26/16 04/26/17  Rebecka Apley, MD  etodolac (LODINE) 200 MG capsule Take 1 capsule (200 mg total) by mouth every 8 (eight) hours. 04/26/16   Rebecka Apley, MD  ketorolac (TORADOL) 10 MG tablet Take 1 tablet (10 mg total)  by mouth every 6 (six) hours as needed. 07/04/14   Chinita Pester, FNP  lidocaine (LIDODERM) 5 % Place 1 patch onto the skin every 12 (twelve) hours. Remove & Discard patch within 12 hours or as directed by MD 04/26/16 04/26/17  Rebecka Apley, MD  lisinopril (PRINIVIL,ZESTRIL) 20 MG tablet Take 1 tablet (20 mg total) by mouth daily. 07/04/14 07/04/15  Chinita Pester, FNP  ondansetron (ZOFRAN) 4 MG tablet Take 1 tablet (4 mg total) by mouth every 8 (eight) hours as needed for nausea or vomiting. 09/09/15   Phineas Semen, MD  ranitidine (ZANTAC) 150 MG tablet Take 1 tablet (150 mg total) by mouth 2 (two) times daily. 09/09/15 09/08/16  Phineas Semen, MD  sucralfate (CARAFATE) 1 g tablet Take 1 tablet (1 g total) by mouth 4 (four) times daily. 09/09/15   Phineas Semen, MD    Allergies Percocet [oxycodone-acetaminophen]  History reviewed. No pertinent family history.  Social History Social History  Substance Use Topics  . Smoking status: Never Smoker  . Smokeless tobacco: Never Used  . Alcohol use Yes     Comment: occaisional    Review of Systems Constitutional: No fever/chills Eyes: No visual changes. ENT: No sore throat. Cardiovascular: Denies chest pain. Respiratory: Denies shortness of breath. Gastrointestinal: No abdominal pain.  No nausea, no vomiting.  No diarrhea.  No constipation. Genitourinary: Negative for dysuria. Musculoskeletal: Neck pain and  back pain. Skin: Negative for rash. Neurological: Negative for headaches, focal weakness or numbness.  10-point ROS otherwise negative.  ____________________________________________   PHYSICAL EXAM:  VITAL SIGNS: ED Triage Vitals  Enc Vitals Group     BP 04/26/16 0037 139/89     Pulse Rate 04/26/16 0037 (!) 102     Resp 04/26/16 0037 18     Temp 04/26/16 0037 98.3 F (36.8 C)     Temp Source 04/26/16 0037 Oral     SpO2 04/26/16 0037 97 %     Weight 04/26/16 0038 225 lb (102.1 kg)     Height 04/26/16 0038   (1.6 m)     Head Circumference --      Peak Flow --      Pain Score 04/26/16 0036 10     Pain Loc --      Pain Edu? --      Excl. in GC? --     Constitutional: Alert and oriented. Well appearing and in Moderate distress. Eyes: Conjunctivae are normal. PERRL. EOMI. Head: Atraumatic. Nose: No congestion/rhinnorhea. Mouth/Throat: Mucous membranes are moist.  Oropharynx non-erythematous. Neck: cervical spine tenderness to palpation. Cardiovascular: Normal rate, regular rhythm. Grossly normal heart sounds.  Good peripheral circulation. Respiratory: Normal respiratory effort.  No retractions. Lungs CTAB. Gastrointestinal: Soft and nontender. No distention. Positive bowel sounds Musculoskeletal: Tenderness to palpation of thoracic and lumbar spine as well as some pain in the sciatic portions of the patient's back bilaterally. Neurologic:  Normal speech and language.  Skin:  Skin is warm, dry and intact.  Psychiatric: Mood and affect are normal. Speech and behavior are normal.  ____________________________________________   LABS (all labs ordered are listed, but only abnormal results are displayed)  Labs Reviewed - No data to display ____________________________________________  EKG  none ____________________________________________  RADIOLOGY  DG cervical spine DG lumbar spine ____________________________________________   PROCEDURES  Procedure(s) performed: None  Procedures  Critical Care performed: No  ____________________________________________   INITIAL IMPRESSION / ASSESSMENT AND PLAN / ED COURSE  Pertinent labs & imaging results that were available during my care of the patient were reviewed by me and considered in my medical decision making (see chart for details).  This is a 41 year old female who comes into the hospital today with neck and back pain. The patient does have some chronic pain but she reports that she hurts too much to move. I will give the  patient a shot of Toradol, Valium, tramadol and a Lidoderm patch to her lumbar spine. I will also send the patient for x-rays of her cervical spine and back.  Clinical Course as of Apr 27 343  Mon Apr 26, 2016  0233 Negative. DG Lumbar Spine 2-3 Views [AW]  0233 Solid fusion at C5-C6.  Normal alignment DG Cervical Spine 2-3 Views [AW]    Clinical Course User Index [AW] Rebecka Apley, MD    The patient's imaging studies are unremarkable. Her pain has some mild improvement. She'll be discharged home and encouraged to follow up with her spine doctor. The patient did receive a dose of hydrocodone.  ____________________________________________   FINAL CLINICAL IMPRESSION(S) / ED DIAGNOSES  Final diagnoses:  Chronic low back pain without sciatica, unspecified back pain laterality  Cervical radiculopathy      NEW MEDICATIONS STARTED DURING THIS VISIT:  New Prescriptions   DIAZEPAM (VALIUM) 5 MG TABLET    Take 1 tablet (5 mg total) by mouth every 8 (eight) hours as needed for anxiety.  ETODOLAC (LODINE) 200 MG CAPSULE    Take 1 capsule (200 mg total) by mouth every 8 (eight) hours.   LIDOCAINE (LIDODERM) 5 %    Place 1 patch onto the skin every 12 (twelve) hours. Remove & Discard patch within 12 hours or as directed by MD     Note:  This document was prepared using Dragon voice recognition software and may include unintentional dictation errors.    Rebecka Apley, MD 04/26/16 [phone]

## 2016-04-26 NOTE — ED Notes (Signed)
Pt. Going home with familyl.

## 2016-12-04 ENCOUNTER — Encounter: Payer: Self-pay | Admitting: Emergency Medicine

## 2016-12-04 ENCOUNTER — Emergency Department
Admission: EM | Admit: 2016-12-04 | Discharge: 2016-12-04 | Disposition: A | Discharge status: Home / Self Care | Payer: Commercial Managed Care - PPO | Attending: Student in an Organized Health Care Education/Training Program | Admitting: Student in an Organized Health Care Education/Training Program

## 2016-12-04 ENCOUNTER — Other Ambulatory Visit: Payer: Self-pay

## 2016-12-04 DIAGNOSIS — Z79899 Other long term (current) drug therapy: Secondary | ICD-10-CM | POA: Insufficient documentation

## 2016-12-04 DIAGNOSIS — I10 Essential (primary) hypertension: Secondary | ICD-10-CM

## 2016-12-04 DIAGNOSIS — J069 Acute upper respiratory infection, unspecified: Secondary | ICD-10-CM

## 2016-12-04 DIAGNOSIS — J399 Disease of upper respiratory tract, unspecified: Secondary | ICD-10-CM | POA: Diagnosis not present

## 2016-12-04 DIAGNOSIS — J029 Acute pharyngitis, unspecified: Secondary | ICD-10-CM | POA: Insufficient documentation

## 2016-12-04 LAB — POCT RAPID STREP A: STREPTOCOCCUS, GROUP A SCREEN (DIRECT): NEGATIVE

## 2016-12-04 MED ORDER — AMOXICILLIN 500 MG PO TABS: 500.0000 mg | ORAL_TABLET | Freq: Three times a day (TID) | ORAL | 0 refills | Status: DC

## 2016-12-04 MED ORDER — PROMETHAZINE-CODEINE 6.25-10 MG/5ML PO SYRP: 5.0000 mL | ORAL_SOLUTION | Freq: Four times a day (QID) | ORAL | 0 refills | Status: DC | PRN

## 2016-12-04 NOTE — ED Triage Notes (Signed)
Pt reports that she has had a sore throat for three days, denies a fever. She reports that she does have a headache with it.

## 2016-12-04 NOTE — ED Provider Notes (Signed)
[facility] Emergency Department Provider Note  ____________________________________________  Time seen: Approximately 5:23 PM  I have reviewed the triage vital signs and the nursing notes.   HISTORY  Chief Complaint Sore Throat    HPI Lacey King Raul Dellston is a 41 y.o. female who presents to the emergency department for evaluation and treatment of 4 days of sore throat and headache.  She has taken Tylenol and ibuprofen without relief. Past Medical History:  Diagnosis Date  . Hypertension     There are no active problems to display for this patient.   Past Surgical History:  Procedure Laterality Date  . BOWEL RESECTION      Prior to Admission medications   Medication Sig Start Date End Date Taking? Authorizing Provider  amoxicillin (AMOXIL) 500 MG tablet Take 1 tablet (500 mg total) by mouth 3 (three) times daily. 12/04/16   Latarshia Jersey B, FNP  diazepam (VALIUM) 5 MG tablet Take 1 tablet (5 mg total) by mouth every 8 (eight) hours as needed for anxiety. 04/26/16 04/26/17  Rebecka ApleyWebster, Allison P, MD  etodolac (LODINE) 200 MG capsule Take 1 capsule (200 mg total) by mouth every 8 (eight) hours. 04/26/16   Rebecka ApleyWebster, Allison P, MD  ketorolac (TORADOL) 10 MG tablet Take 1 tablet (10 mg total) by mouth every 6 (six) hours as needed. 07/04/14   Nickey Canedo B, FNP  lidocaine (LIDODERM) 5 % Place 1 patch onto the skin every 12 (twelve) hours. Remove & Discard patch within 12 hours or as directed by MD 04/26/16 04/26/17  Rebecka ApleyWebster, Allison P, MD  lisinopril (PRINIVIL,ZESTRIL) 20 MG tablet Take 1 tablet (20 mg total) by mouth daily. 07/04/14 07/04/15  Hollyn Stucky, Kasandra Knudsenari B, FNP  ondansetron (ZOFRAN) 4 MG tablet Take 1 tablet (4 mg total) by mouth every 8 (eight) hours as needed for nausea or vomiting. 09/09/15   Phineas SemenGoodman, Graydon, MD  promethazine-codeine [facility](PHENERGAN WITH CODEINE) 6.25-10 MG/5ML syrup Take 5 mLs by mouth every 6 (six) hours as needed for cough. 12/04/16   Elvie Palomo,  Rulon Eisenmengerari B, FNP  ranitidine (ZANTAC) 150 MG tablet Take 1 tablet (150 mg total) by mouth 2 (two) times daily. 09/09/15 09/08/16  Phineas SemenGoodman, Graydon, MD  sucralfate (CARAFATE) 1 g tablet Take 1 tablet (1 g total) by mouth 4 (four) times daily. 09/09/15   Phineas SemenGoodman, Graydon, MD    Allergies Patient has no known allergies.  History reviewed. No pertinent family history.  Social History Social History   Tobacco Use  . Smoking status: Never Smoker  . Smokeless tobacco: Never Used  Substance Use Topics  . Alcohol use: Yes    Comment: occaisional  . Drug use: Not on file    Review of Systems Constitutional: Negative for fever. Eyes: No visual changes. ENT: Positive for sore throat; negative for difficulty swallowing. Respiratory: Denies shortness of breath. Gastrointestinal: No abdominal pain.  Positive for nausea, no vomiting.  No diarrhea.  Genitourinary: Negative for dysuria. Musculoskeletal: Negative for generalized body aches. Skin: Negative for rash. Neurological: Positive for headaches, negative focal weakness or numbness.  ____________________________________________   PHYSICAL EXAM:  VITAL SIGNS: ED Triage Vitals  Enc Vitals Group     BP 12/04/16 1657 (!) 189/113     Pulse Rate 12/04/16 1657 88     Resp --      Temp 12/04/16 1657 98.4 F (36.9 C)     Temp Source 12/04/16 1657 Oral     SpO2 --      Weight 12/04/16 1657 200  lb (90.7 kg)     Height 12/04/16 1657 5\' 3"  (1.6 m)     Head Circumference --      Peak Flow --      Pain Score 12/04/16 1708 10     Pain Loc --      Pain Edu? --      Excl. in GC? --    Constitutional: Alert and oriented. Well appearing and in no acute distress. Eyes: Conjunctivae are normal.  Head: Atraumatic. Nose: No congestion/rhinnorhea. Mouth/Throat: Mucous membranes are moist.  Oropharynx erythematous, tonsils 2+ with exudate. Uvula is midline. Neck: No stridor.  Lymphatic: Lymphadenopathy: Anterior cervical nodes are tender upon  palpation Cardiovascular: Normal rate, regular rhythm. Good peripheral circulation. Respiratory: Normal respiratory effort. Lungs CTAB. Gastrointestinal: Soft and nontender. Musculoskeletal: No lower extremity tenderness nor edema.  Neurologic:  Normal speech and language. No gross focal neurologic deficits are appreciated. Speech is normal. No gait instability. Skin:  Skin is warm, dry and intact. No rash noted Psychiatric: Mood and affect are normal. Speech and behavior are normal.  ____________________________________________   LABS (all labs ordered are listed, but only abnormal results are displayed)  Labs Reviewed  POCT RAPID STREP A   ____________________________________________  EKG  Not indicated ____________________________________________  RADIOLOGY  Not indicated ____________________________________________   PROCEDURES  Procedure(s) performed: None  Critical Care performed: No ____________________________________________   INITIAL IMPRESSION / ASSESSMENT AND PLAN / ED COURSE  41 year old female presenting to the emergency department for treatment and evaluation of sore throat headache.  Symptoms and exam are consistent with a bacterial pharyngitis and she will be prescribed amoxicillin.  She was also given a prescription for Phenergan with codeine to help with the sore throat pain and nausea.  She was instructed to follow-up with the primary care provider for choice for symptoms that are not improving over the next few days.  She was instructed to return to the emergency department for symptoms of change or worsen if unable to schedule an appointment.  Pertinent labs & imaging results that were available during my care of the patient were reviewed by me and considered in my medical decision making (see chart for details). ____________________________________________  This SmartLink is deprecated. Use AVSMEDLIST instead to display the medication list for a  patient.  FINAL CLINICAL IMPRESSION(S) / ED DIAGNOSES  Final diagnoses:  Pharyngitis, unspecified etiology  Upper respiratory tract infection, unspecified type  Hypertension, unspecified type    If controlled substance prescribed during this visit, 12 month history viewed on the [facility] prior to issuing an initial prescription for Schedule II or III opiod.   Note:  This document was prepared using Dragon voice recognition software and may include unintentional dictation errors.    Chinita Pesterriplett, Luc Shammas B, FNP 12/04/16 1826    Willy Eddyobinson, Patrick, MD 12/04/16 [phone]

## 2016-12-04 NOTE — Discharge Instructions (Signed)
Please take your BP medicine when you get home. Follow up with your primary care provider for recheck. Return to the ER for symptoms that change or worsen.

## 2017-05-06 IMAGING — CR DG CERVICAL SPINE 2 OR 3 VIEWS
1 series · 5 of 5 positions shown · non-contrast
Comparison: None.

CLINICAL DATA: Neck pain

EXAM:
CERVICAL SPINE - 2-3 VIEW

[Series 1: dg cervical spine 2 or 3 views · 0.14mm/px · 5 of 5 slices shown]
[im 1/5]
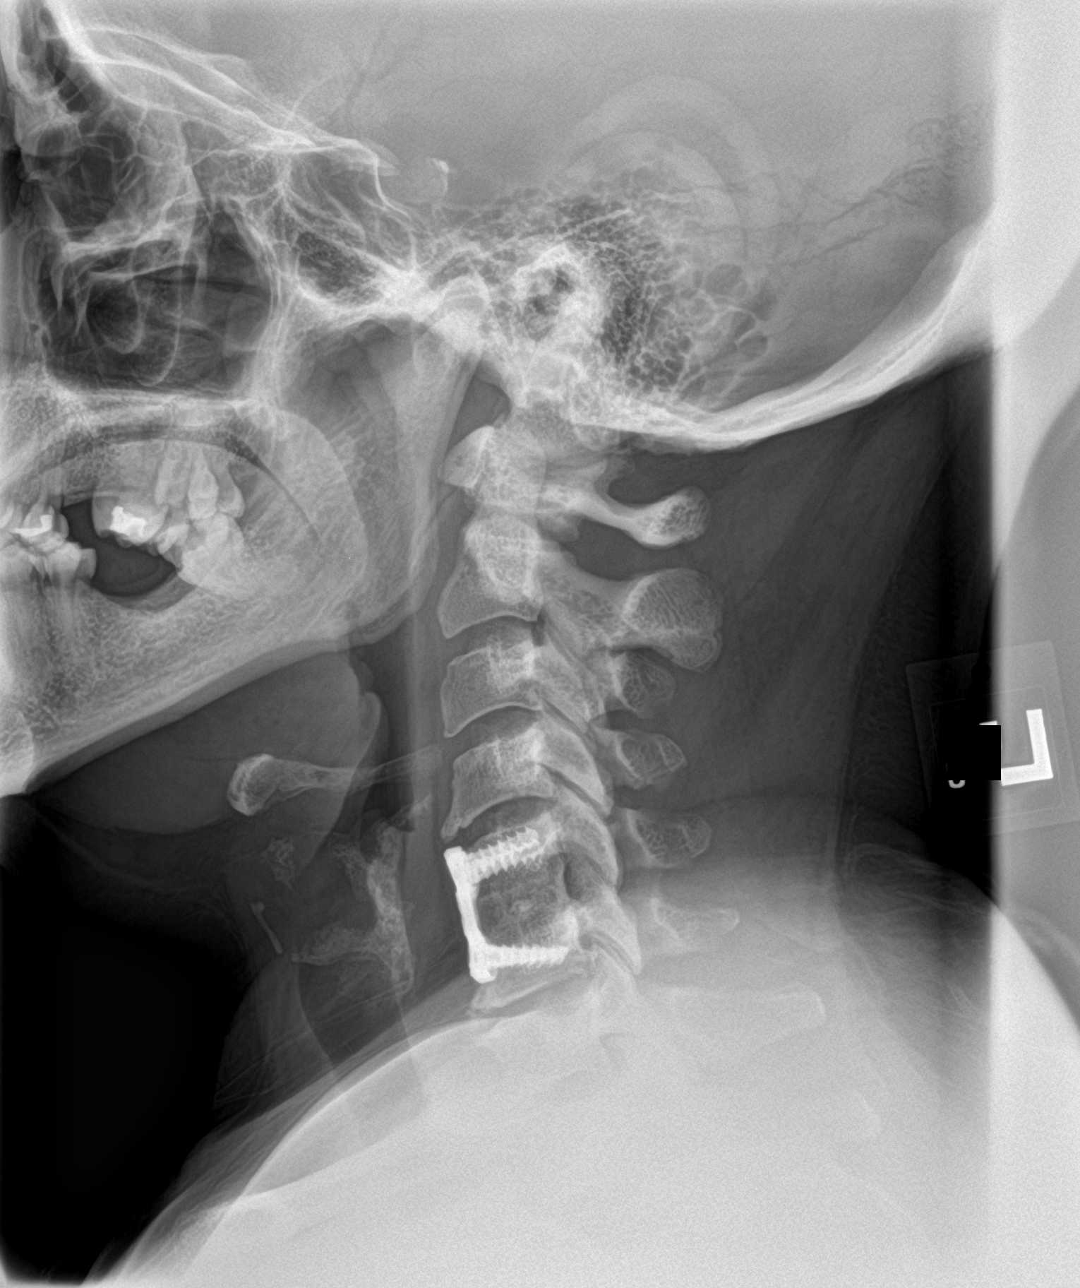
[im 2/5]
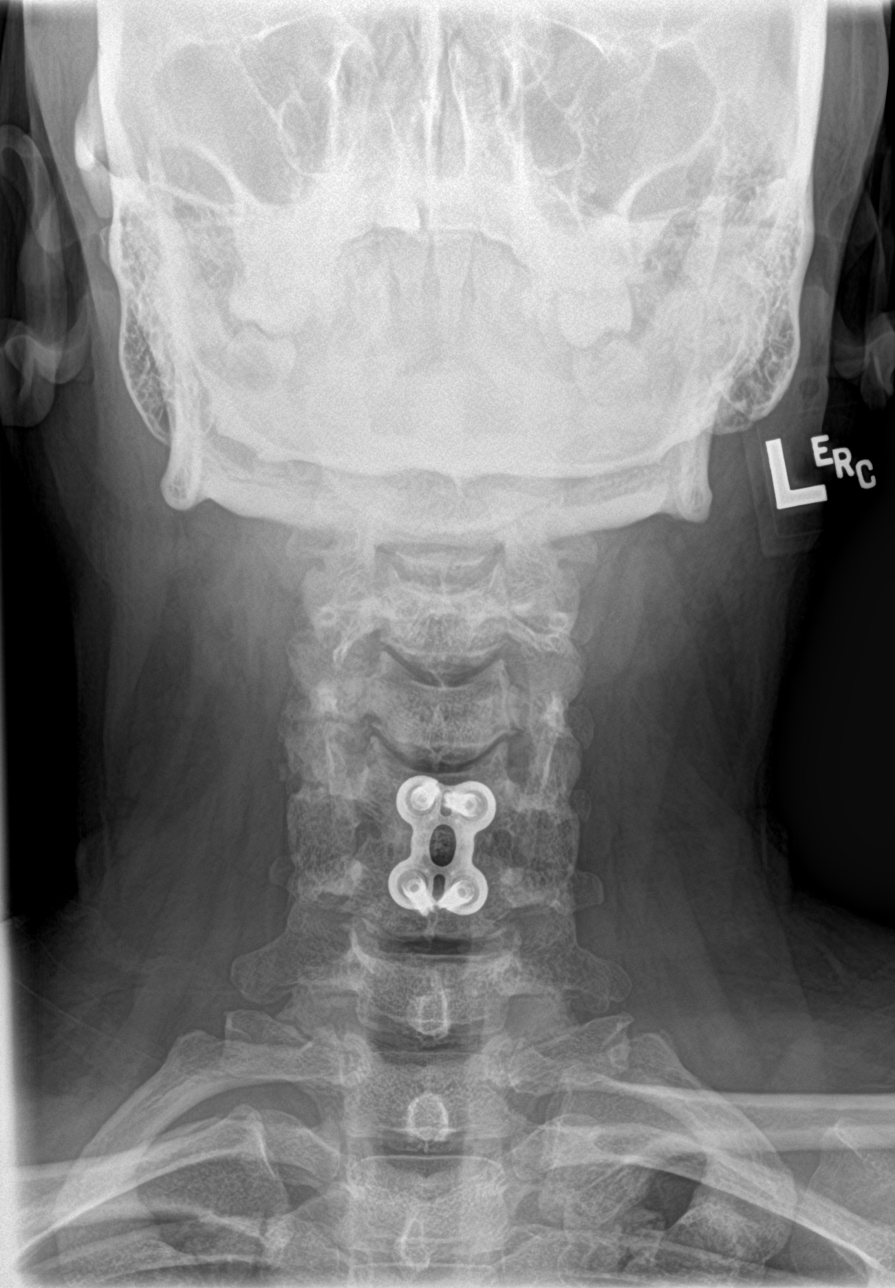
[im 3/5]
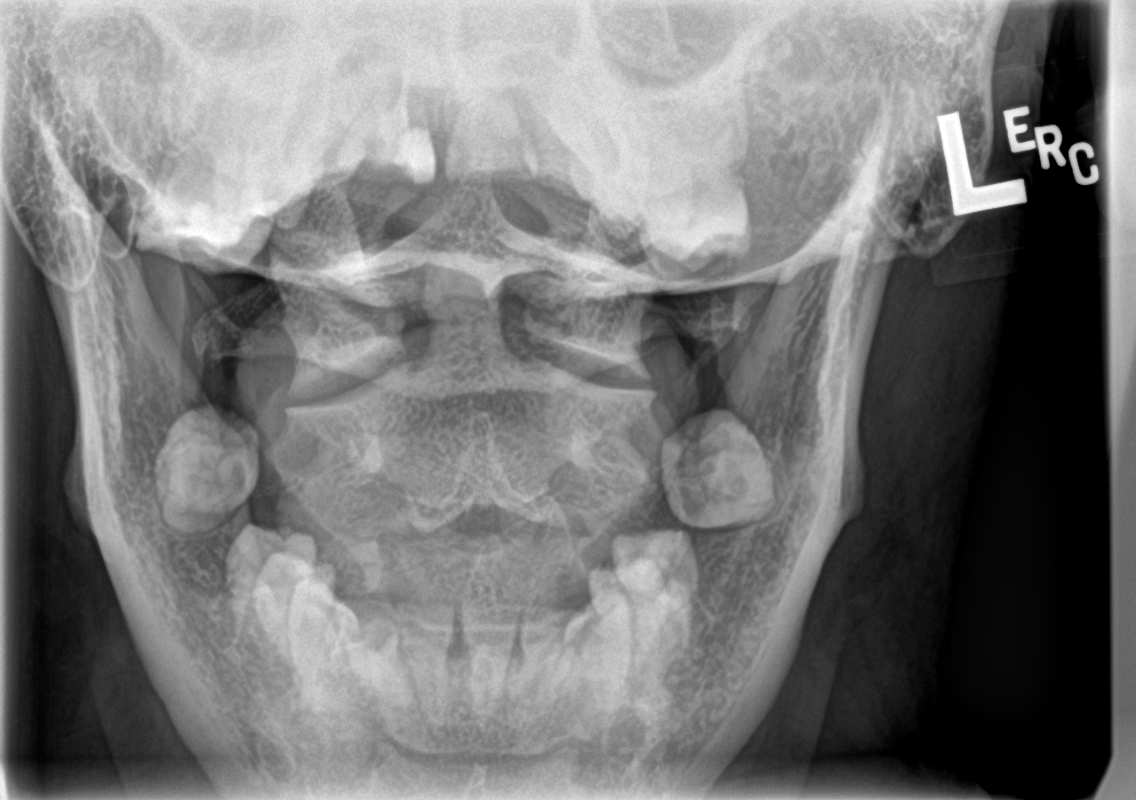
[im 4/5]
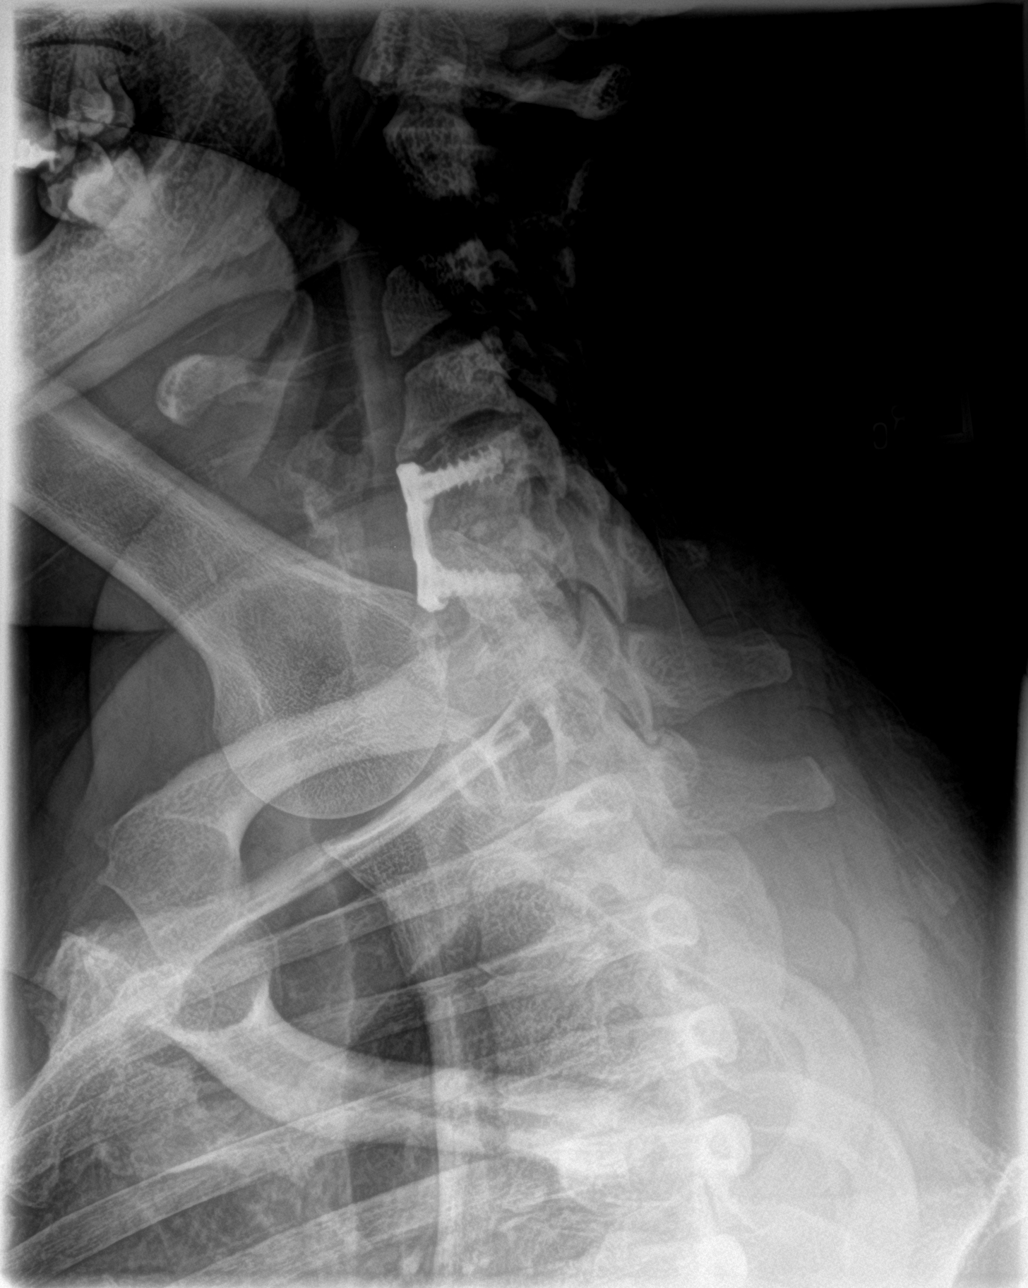
[im 5/5]
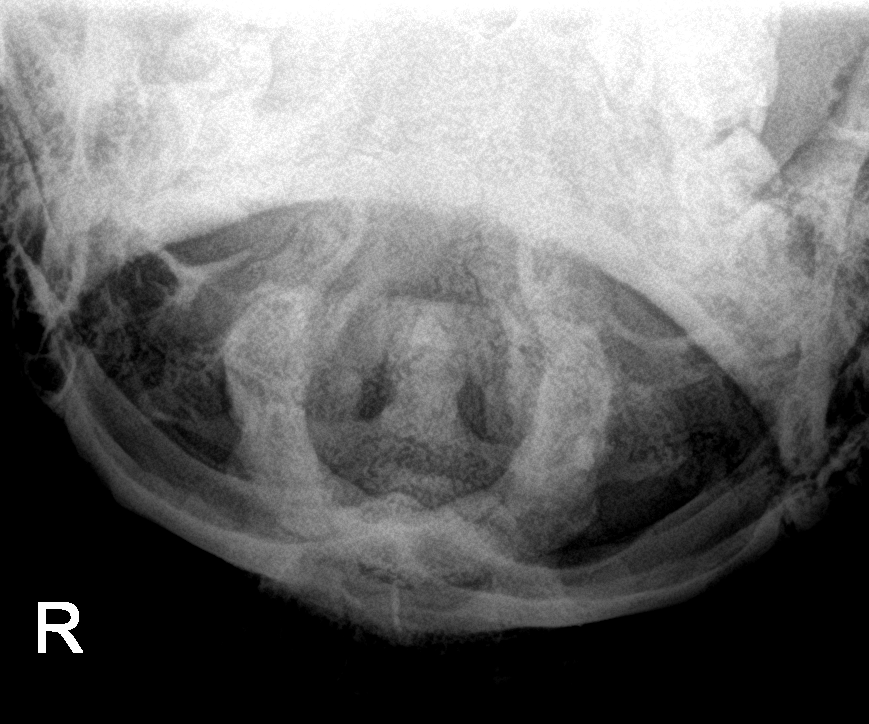

[5 of 5 positions shown; findings below may reference images not displayed]

FINDINGS: C5-C6 ACDF with solid osseous fusion. Cervical spine alignment is
normal. There is adjacent segment disc space narrowing at C6-C7. The
lateral masses of C1 and C2 are aligned. The dens is intact.
IMPRESSION: Solid fusion at C5-C6.  Normal alignment.

## 2018-12-14 ENCOUNTER — Emergency Department
Admission: EM | Admit: 2018-12-14 | Discharge: 2018-12-14 | Disposition: A | Discharge status: Home / Self Care | Payer: Self-pay | Attending: Emergency Medicine | Admitting: Emergency Medicine

## 2018-12-14 ENCOUNTER — Other Ambulatory Visit: Payer: Self-pay

## 2018-12-14 ENCOUNTER — Encounter: Payer: Self-pay | Admitting: Emergency Medicine

## 2018-12-14 ENCOUNTER — Emergency Department: Payer: Self-pay

## 2018-12-14 DIAGNOSIS — M5431 Sciatica, right side: Secondary | ICD-10-CM

## 2018-12-14 DIAGNOSIS — Z79899 Other long term (current) drug therapy: Secondary | ICD-10-CM | POA: Insufficient documentation

## 2018-12-14 HISTORY — DX: Anxiety disorder, unspecified: F41.9

## 2018-12-14 LAB — URINALYSIS, COMPLETE (UACMP) WITH MICROSCOPIC
Bacteria, UA: NONE SEEN
Bilirubin Urine: NEGATIVE
Glucose, UA: 50 mg/dL — AB
Ketones, ur: NEGATIVE mg/dL
Leukocytes,Ua: NEGATIVE
Nitrite: NEGATIVE
Protein, ur: 30 mg/dL — AB
Specific Gravity, Urine: 1.032 — ABNORMAL HIGH (ref 1.005–1.030)
pH: 5 (ref 5.0–8.0)

## 2018-12-14 MED ORDER — METHYLPREDNISOLONE SODIUM SUCC 125 MG IJ SOLR: 80.0000 mg | Freq: Once | INTRAMUSCULAR | Status: DC

## 2018-12-14 MED ORDER — ORPHENADRINE CITRATE 30 MG/ML IJ SOLN
60.0000 mg | Freq: Two times a day (BID) | INTRAMUSCULAR | Status: DC
  Administered 2018-12-14: 60 mg via INTRAMUSCULAR
  Filled 2018-12-14: qty 2

## 2018-12-14 MED ORDER — LIDOCAINE 5 % EX PTCH: 1.0000 | MEDICATED_PATCH | CUTANEOUS | 0 refills | Status: AC

## 2018-12-14 MED ORDER — LIDOCAINE 5 % EX PTCH
1.0000 | MEDICATED_PATCH | CUTANEOUS | Status: DC
  Administered 2018-12-14: 1 via TRANSDERMAL
  Filled 2018-12-14: qty 1

## 2018-12-14 MED ORDER — METAXALONE 800 MG PO TABS: 800.0000 mg | ORAL_TABLET | Freq: Three times a day (TID) | ORAL | 0 refills | Status: AC

## 2018-12-14 MED ORDER — ACETAMINOPHEN 500 MG PO TABS: 500.0000 mg | ORAL_TABLET | Freq: Four times a day (QID) | ORAL | 0 refills | Status: AC | PRN

## 2018-12-14 MED ORDER — PREDNISONE 10 MG PO TABS: ORAL_TABLET | ORAL | 0 refills | Status: DC

## 2018-12-14 MED ORDER — KETOROLAC TROMETHAMINE 30 MG/ML IJ SOLN
30.0000 mg | Freq: Once | INTRAMUSCULAR | Status: AC
  Administered 2018-12-14: 30 mg via INTRAMUSCULAR
  Filled 2018-12-14: qty 1

## 2018-12-14 NOTE — Discharge Instructions (Signed)
Please use [email or web address] or good Rx card for coupon for your blood pressure medications.  Take Tylenol for pain.  Take Skelaxin to help relax your muscles.  Start prednisone for inflammation.  Please follow-up with primary care and neurosurgery.

## 2018-12-14 NOTE — ED Triage Notes (Signed)
Pt to the er for lower and upper back pain. Pt has a hx of disc issues and pt states the pain became worse after going back to work at [employer]. Pt is also out of HTN meds due to losing her insurance x 3 weeks. Pt has numbness and tingling to the right leg.

## 2018-12-14 NOTE — ED Provider Notes (Signed)
[facility] Emergency Department Provider Note  ____________________________________________  Time seen: Approximately 4:06 PM  I have reviewed the triage vital signs and the nursing notes.   HISTORY  Chief Complaint Back Pain    HPI Lacey King Raul Del is a 43 y.o. female that presents to the emergency department for evaluation of low back pain that radiates into right leg for 3 weeks.  Patient states that pain shoots down her right leg.  She also gets some numbness to the outside of her leg.  Leg does not feel weak.  She does have difficulty moving her leg secondary to pain.  She has always had this with her back pain.  Regularly her radicular and numbness symptoms are actually to both of her legs.  Patient states that she has a history of a herniated disc and has had intermittent back pain for a long time.  Patient states that back pain is consistent with her previous back pain.  Back pain has been more constant since she started her new job at [employer] 3 weeks ago.  Patient states that her back is sore when she gets home every single night.  She has not followed up with anyone.  No bowel or bladder dysfunction or saddle anesthesias.  Patient does note that she urinates frequently.  No fevers, abdominal pain.  Patient has a history of hypertension and states that she has not been able to pick up her medications for about 3 weeks now.  She states that she lost the insurance with her husband and when she went to the pharmacy to pick up her medications, she could not afford them.  She does still have refills on these medications.  She believes that she is on losartan.  No vomiting, dysuria.  Past Medical History:  Diagnosis Date  . Anxiety   . Hypertension     There are no active problems to display for this patient.   Past Surgical History:  Procedure Laterality Date  . BOWEL RESECTION    . NECK SURGERY      Prior to Admission medications   Medication Sig  Start Date End Date Taking? Authorizing Provider  acetaminophen (TYLENOL) 500 MG tablet Take 1 tablet (500 mg total) by mouth every 6 (six) hours as needed. 12/14/18   Enid Derry, PA-C  lidocaine (LIDODERM) 5 % Place 1 patch onto the skin daily. Remove & Discard patch within 12 hours or as directed by MD 12/14/18   Enid Derry, PA-C  lisinopril (PRINIVIL,ZESTRIL) 20 MG tablet Take 1 tablet (20 mg total) by mouth daily. 07/04/14 07/04/15  Triplett, Rulon Eisenmenger B, FNP  metaxalone (SKELAXIN) 800 MG tablet Take 1 tablet (800 mg total) by mouth 3 (three) times daily. 12/14/18 12/14/19  Enid Derry, PA-C  predniSONE (DELTASONE) 10 MG tablet Take 6 tablets on day 1, take 5 tablets on day 2, take 4 tablets on day 3, take 3 tablets on day 4, take 2 tablets on day 5, take 1 tablet on day 6 12/14/18   Enid Derry, PA-C    Allergies Nyquil hbp cold & flu [dm-doxylamine-acetaminophen] and Percocet [oxycodone-acetaminophen]  No family history on file.  Social History Social History   Tobacco Use  . Smoking status: Never Smoker  . Smokeless tobacco: Never Used  Substance Use Topics  . Alcohol use: Yes    Comment: occaisional  . Drug use: Never     Review of Systems  Constitutional: No fever/chills ENT: No upper respiratory complaints. Cardiovascular: No chest  pain. Respiratory: No cough. No SOB. Gastrointestinal: No abdominal pain.  No nausea, no vomiting.  Genitourinary: Negative for dysuria. Musculoskeletal: Positive for back pain. Skin: Negative for rash, abrasions, lacerations, ecchymosis. Neurological: Negative for headaches.    ____________________________________________   PHYSICAL EXAM:  VITAL SIGNS: ED Triage Vitals  Enc Vitals Group     BP 12/14/18 1503 (!) 182/105     Pulse Rate 12/14/18 1503 90     Resp 12/14/18 1503 18     Temp 12/14/18 1503 98.7 F (37.1 C)     Temp Source 12/14/18 1503 Oral     SpO2 12/14/18 1503 100 %     Weight 12/14/18 1504 182 lb (82.6 kg)      Height 12/14/18 1504 5\' 3"  (1.6 m)     Head Circumference --      Peak Flow --      Pain Score 12/14/18 1503 8     Pain Loc --      Pain Edu? --      Excl. in GC? --      Constitutional: Alert and oriented. Well appearing and in no acute distress. Eyes: Conjunctivae are normal. PERRL. EOMI. Head: Atraumatic. ENT:      Ears:      Nose: No congestion/rhinnorhea.      Mouth/Throat: Mucous membranes are moist.  Neck: No stridor. Cardiovascular: Normal rate, regular rhythm.  Good peripheral circulation. Respiratory: Normal respiratory effort without tachypnea or retractions. Lungs CTAB. Good air entry to the bases with no decreased or absent breath sounds. Gastrointestinal: Bowel sounds 4 quadrants. Soft and nontender to palpation. No guarding or rigidity. No palpable masses. No distention. No CVA tenderness. Musculoskeletal: Full range of motion to all extremities. No gross deformities appreciated.  Diffuse tenderness to palpation throughout lumbar spine and right lumbar paraspinal muscles.  Strength reduced in right leg secondary to pain.  Antalgic gait.  Full range of motion's of all 10 toes. Neurologic:  Normal speech and language. No gross focal neurologic deficits are appreciated.  Skin:  Skin is warm, dry and intact. No rash noted. Psychiatric: Mood and affect are normal. Speech and behavior are normal. Patient exhibits appropriate insight and judgement.   ____________________________________________   LABS (all labs ordered are listed, but only abnormal results are displayed)  Labs Reviewed  URINALYSIS, COMPLETE (UACMP) WITH MICROSCOPIC - Abnormal; Notable for the following components:      Result Value   Color, Urine YELLOW (*)    APPearance CLEAR (*)    Specific Gravity, Urine 1.032 (*)    Glucose, UA 50 (*)    Hgb urine dipstick SMALL (*)    Protein, ur 30 (*)    All other components within normal limits    ____________________________________________  EKG   ____________________________________________  RADIOLOGY Lexine BatonI, Yuliana Vandrunen, personally viewed and evaluated these images (plain radiographs) as part of my medical decision making, as well as reviewing the written report by the radiologist.  Dg Lumbar Spine 2-3 Views  Result Date: 12/14/2018 CLINICAL DATA:  43 year old female with back pain. EXAM: LUMBAR SPINE - 2-3 VIEW COMPARISON:  Lumbar spine radiograph dated 04/26/2016. FINDINGS: Five lumbar type vertebra. There is no acute fracture or subluxation of the lumbar spine. There is straightening of normal lumbar lordosis. Mild chronic compression fracture of the superior endplate of L4 with mild anterior wedging similar to prior radiograph. Mild degenerative changes at L5-S1 with bone spurring and probable mild neural foramina narrowing at this level. The visualized posterior elements appear  intact. The soft tissues are unremarkable. IMPRESSION: 1. No acute fracture or subluxation of the lumbar spine. 2. Stable mild chronic compression fracture of the superior endplate of L4. Electronically Signed   By: Anner Crete M.D.   On: 12/14/2018 16:31    ____________________________________________    PROCEDURES  Procedure(s) performed:    Procedures    Medications  orphenadrine (NORFLEX) injection 60 mg (60 mg Intramuscular Given 12/14/18 1631)  lidocaine (LIDODERM) 5 % 1 patch (1 patch Transdermal Patch Applied 12/14/18 1629)  ketorolac (TORADOL) 30 MG/ML injection 30 mg (30 mg Intramuscular Given 12/14/18 1631)     ____________________________________________   INITIAL IMPRESSION / ASSESSMENT AND PLAN / ED COURSE  Pertinent labs & imaging results that were available during my care of the patient were reviewed by me and considered in my medical decision making (see chart for details).  Review of the [place] CSRS was performed in accordance of the [place] prior to dispensing any  controlled drugs.  Patient's diagnosis is consistent with sciatica.  Vital signs and exam are reassuring.  X-ray shows mild stable compression fracture.  Patient denies any new back pain symptoms.  This feels the same as her previous back pain.  No bowel or bladder dysfunction or saddle anesthesias.  Pain improved with medications.  Patient blood pressure is elevated in the emergency department.  Patient does have refills on her blood pressure medications but was not previously able to afford them.  We looked together on good Rx and she will be able to afford her losartan at [facility].  She is agreeable to call pharmacy tomorrow to have prescriptions transferred.  Patient will be discharged home with prescriptions for prednisone, skelaxin, lidoderm and tylenol. Patient is to follow up with PCP and neuorsurgery as directed.  Referral was given.  Patient is given ED precautions to return to the ED for any worsening or new symptoms.  Azalee Weimer was evaluated in Emergency Department on 12/14/2018 for the symptoms described in the history of present illness. She was evaluated in the context of the global COVID-19 pandemic, which necessitated consideration that the patient might be at risk for infection with the SARS-CoV-2 virus that causes COVID-19. Institutional protocols and algorithms that pertain to the evaluation of patients at risk for COVID-19 are in a state of rapid change based on information released by regulatory bodies including the CDC and federal and state organizations. These policies and algorithms were followed during the patient's care in the ED.   ____________________________________________  FINAL CLINICAL IMPRESSION(S) / ED DIAGNOSES  Final diagnoses:  Sciatica of right side      NEW MEDICATIONS STARTED DURING THIS VISIT:  ED Discharge Orders         Ordered    predniSONE (DELTASONE) 10 MG tablet     12/14/18 1746    metaxalone (SKELAXIN) 800 MG tablet  3 times daily      12/14/18 1746    lidocaine (LIDODERM) 5 %  Every 24 hours     12/14/18 1746    acetaminophen (TYLENOL) 500 MG tablet  Every 6 hours PRN     12/14/18 1746              This chart was dictated using voice recognition software/Dragon. Despite best efforts to proofread, errors can occur which can change the meaning. Any change was purely unintentional.    Laban Emperor, PA-C 12/15/18 Cori Razor, MD 12/17/18 [phone]

## 2018-12-14 NOTE — ED Notes (Signed)
See triage note  Presents with lower back pain  And numbness to right leg  States she has a hx of disc problems  Went back to work and has been lifting boxes

## 2019-07-24 ENCOUNTER — Telehealth: Payer: Self-pay

## 2019-07-24 NOTE — Telephone Encounter (Signed)
Individual has been contacted 3+ times regarding ED referral. No further attempts to contact individual will be made. 

## 2020-06-09 ENCOUNTER — Other Ambulatory Visit: Payer: Self-pay

## 2020-06-09 ENCOUNTER — Emergency Department
Admission: EM | Admit: 2020-06-09 | Discharge: 2020-06-09 | Disposition: A | Discharge status: Home / Self Care | Payer: BC Managed Care – PPO | Attending: Emergency Medicine | Admitting: Emergency Medicine

## 2020-06-09 ENCOUNTER — Encounter: Payer: Self-pay | Admitting: Intensive Care

## 2020-06-09 DIAGNOSIS — R059 Cough, unspecified: Secondary | ICD-10-CM | POA: Diagnosis present

## 2020-06-09 DIAGNOSIS — Z20822 Contact with and (suspected) exposure to covid-19: Secondary | ICD-10-CM | POA: Insufficient documentation

## 2020-06-09 DIAGNOSIS — I10 Essential (primary) hypertension: Secondary | ICD-10-CM | POA: Diagnosis not present

## 2020-06-09 DIAGNOSIS — B349 Viral infection, unspecified: Secondary | ICD-10-CM | POA: Diagnosis not present

## 2020-06-09 HISTORY — DX: Pure hypercholesterolemia, unspecified: E78.00

## 2020-06-09 LAB — RESP PANEL BY RT-PCR (FLU A&B, COVID) ARPGX2
Influenza A by PCR: NEGATIVE
Influenza B by PCR: NEGATIVE
SARS Coronavirus 2 by RT PCR: NEGATIVE

## 2020-06-09 MED ORDER — PREDNISONE 10 MG PO TABS: 10.0000 mg | ORAL_TABLET | Freq: Every day | ORAL | 0 refills | Status: AC

## 2020-06-09 MED ORDER — FLUTICASONE PROPIONATE 50 MCG/ACT NA SUSP: 1.0000 | Freq: Two times a day (BID) | NASAL | 0 refills | Status: AC

## 2020-06-09 MED ORDER — BENZONATATE 100 MG PO CAPS: 100.0000 mg | ORAL_CAPSULE | Freq: Four times a day (QID) | ORAL | 0 refills | Status: AC | PRN

## 2020-06-09 NOTE — ED Provider Notes (Signed)
[facility] Emergency Department Provider Note  ____________________________________________  Time seen: Approximately 4:46 PM  I have reviewed the triage vital signs and the nursing notes.   HISTORY  Chief Complaint Nasal Congestion, Sore Throat (/), and Headache    HPI Lacey King is a 45 y.o. female who presents the emergency department complaining of nasal congestion, scratchy throat, cough, body aches times a week.  Patient's symptoms started a week ago, have persisted.  There is been no overall improvement.  Patient also reports though that she has not been worsening.  Symptoms have just been persisting for the entire week.  She tried over-the-counter medications with no relief.  She denies any neck pain or stiffness, chest pain, shortness of breath, abdominal pain, nausea vomiting, diarrhea or constipation.         Past Medical History:  Diagnosis Date  . Anxiety   . High cholesterol   . Hypertension     There are no problems to display for this patient.   Past Surgical History:  Procedure Laterality Date  . BOWEL RESECTION    . NECK SURGERY      Prior to Admission medications   Medication Sig Start Date End Date Taking? Authorizing Provider  benzonatate (TESSALON PERLES) 100 MG capsule Take 1 capsule (100 mg total) by mouth every 6 (six) hours as needed for cough. 06/09/20 06/09/21 Yes Jatoria Kneeland, Delorise Royals, PA-C  fluticasone (FLONASE) 50 MCG/ACT nasal spray Place 1 spray into both nostrils 2 (two) times daily. 06/09/20  Yes Lakeria Starkman, Delorise Royals, PA-C  predniSONE (DELTASONE) 10 MG tablet Take 1 tablet (10 mg total) by mouth daily. 06/09/20  Yes Levar Fayson, Delorise Royals, PA-C  acetaminophen (TYLENOL) 500 MG tablet Take 1 tablet (500 mg total) by mouth every 6 (six) hours as needed. 12/14/18   Enid Derry, PA-C  lidocaine (LIDODERM) 5 % Place 1 patch onto the skin daily. Remove & Discard patch within 12 hours or as directed by MD 12/14/18    Enid Derry, PA-C  lisinopril (PRINIVIL,ZESTRIL) 20 MG tablet Take 1 tablet (20 mg total) by mouth daily. 07/04/14 07/04/15  Chinita Pester, FNP    Allergies Nyquil hbp cold & flu [dm-doxylamine-acetaminophen] and Percocet [oxycodone-acetaminophen]  History reviewed. No pertinent family history.  Social History Social History   Tobacco Use  . Smoking status: Never Smoker  . Smokeless tobacco: Never Used  Substance Use Topics  . Alcohol use: Yes    Comment: occaisional  . Drug use: Never     Review of Systems  Constitutional: Intermittent fever/chills Eyes: No visual changes. No discharge ENT: Nasal congestion and scratchy throat Cardiovascular: no chest pain. Respiratory: Positive cough. No SOB. Gastrointestinal: No abdominal pain.  No nausea, no vomiting.  No diarrhea.  No constipation. Musculoskeletal: Negative for musculoskeletal pain. Skin: Negative for rash, abrasions, lacerations, ecchymosis. Neurological: Negative for headaches, focal weakness or numbness.  10 System ROS otherwise negative.  ____________________________________________   PHYSICAL EXAM:  VITAL SIGNS: ED Triage Vitals [06/09/20 1532]  Enc Vitals Group     BP (!) 130/96     Pulse Rate 65     Resp 16     Temp 98.3 F (36.8 C)     Temp Source Oral     SpO2 100 %     Weight 220 lb (99.8 kg)     Height 5\' 3"  (1.6 m)     Head Circumference      Peak Flow      Pain  Score 8     Pain Loc      Pain Edu?      Excl. in GC?      Constitutional: Alert and oriented. Well appearing and in no acute distress. Eyes: Conjunctivae are normal. PERRL. EOMI. Head: Atraumatic. ENT:      Ears:       Nose: No congestion/rhinnorhea.      Mouth/Throat: Mucous membranes are moist.  Neck: No stridor.  Neck is supple full range of motion.  No tenderness. Hematological/Lymphatic/Immunilogical: No cervical lymphadenopathy. Cardiovascular: Normal rate, regular rhythm. Normal S1 and S2.  Good peripheral  circulation. Respiratory: Normal respiratory effort without tachypnea or retractions. Lungs without wheezing, rales or rhonchi. Good air entry to the bases with no decreased or absent breath sounds. Gastrointestinal: Bowel sounds 4 quadrants. Soft and nontender to palpation. No guarding or rigidity. No palpable masses. No distention. No CVA tenderness. Musculoskeletal: Full range of motion to all extremities. No gross deformities appreciated. Neurologic:  Normal speech and language. No gross focal neurologic deficits are appreciated.  Skin:  Skin is warm, dry and intact. No rash noted. Psychiatric: Mood and affect are normal. Speech and behavior are normal. Patient exhibits appropriate insight and judgement.   ____________________________________________   LABS (all labs ordered are listed, but only abnormal results are displayed)  Labs Reviewed  RESP PANEL BY RT-PCR (FLU A&B, COVID) [id number]   ____________________________________________  EKG   ____________________________________________  RADIOLOGY   No results found.  ____________________________________________    PROCEDURES  Procedure(s) performed:    Procedures    Medications - No data to display   ____________________________________________   INITIAL IMPRESSION / ASSESSMENT AND PLAN / ED COURSE  Pertinent labs & imaging results that were available during my care of the patient were reviewed by me and considered in my medical decision making (see chart for details).  Review of the [place] CSRS was performed in accordance of the [facility] prior to dispensing any controlled drugs.           Patient's diagnosis is consistent with viral illness.  Patient presented to the emergency department with viral symptoms x1 week.  Patient was concerned as symptoms had been persisting without significant improvement.  There was no concerning signs and symptoms requiring labs or imaging at this time.  Patient will be treated  symptomatically.  Will test for COVID and influenza and patient will follow results in MyChart.  Return precautions discussed with the patient.  Follow-up primary care as needed..  Patient is given ED precautions to return to the ED for any worsening or new symptoms.     ____________________________________________  FINAL CLINICAL IMPRESSION(S) / ED DIAGNOSES  Final diagnoses:  Viral illness      NEW MEDICATIONS STARTED DURING THIS VISIT:  ED Discharge Orders         Ordered    predniSONE (DELTASONE) 10 MG tablet  Daily        06/09/20 1701    benzonatate (TESSALON PERLES) 100 MG capsule  Every 6 hours PRN        06/09/20 1701    fluticasone (FLONASE) 50 MCG/ACT nasal spray  2 times daily        06/09/20 1701              This chart was dictated using voice recognition software/Dragon. Despite best efforts to proofread, errors can occur which can change the meaning. Any change was purely unintentional.    Racheal Patches, PA-C 06/09/20 1736  Chesley Noon, MD 06/12/20 [phone]

## 2020-06-09 NOTE — ED Triage Notes (Addendum)
C/o sore throat, headache,fatigue, cough and congestion for over a week. Denies taking any medication today. Reports can only taste salt

## 2021-05-13 ENCOUNTER — Ambulatory Visit: Payer: Medicaid Other

## 2021-05-19 ENCOUNTER — Ambulatory Visit: Payer: Medicaid Other

## 2021-06-10 ENCOUNTER — Encounter: Payer: Self-pay | Admitting: Nurse Practitioner

## 2021-06-10 ENCOUNTER — Ambulatory Visit: Payer: Self-pay | Admitting: Nurse Practitioner

## 2021-06-10 DIAGNOSIS — Z113 Encounter for screening for infections with a predominantly sexual mode of transmission: Secondary | ICD-10-CM

## 2021-06-10 DIAGNOSIS — F419 Anxiety disorder, unspecified: Secondary | ICD-10-CM

## 2021-06-10 DIAGNOSIS — F32A Depression, unspecified: Secondary | ICD-10-CM

## 2021-06-10 DIAGNOSIS — B379 Candidiasis, unspecified: Secondary | ICD-10-CM

## 2021-06-10 LAB — WET PREP FOR TRICH, YEAST, CLUE
Trichomonas Exam: NEGATIVE
Yeast Exam: NEGATIVE

## 2021-06-10 LAB — HM HIV SCREENING LAB: HM HIV Screening: NEGATIVE

## 2021-06-10 MED ORDER — CLOTRIMAZOLE 3 2 % VA CREA: 1.0000 | TOPICAL_CREAM | Freq: Every day | VAGINAL | 0 refills | Status: AC

## 2021-06-10 NOTE — Progress Notes (Addendum)
Lacey King  STI clinic/screening visit [address] [place] [address] [phone]  Subjective:  Lacey King is a 46 y.o. female being seen today for an STI screening visit. The patient reports they do have symptoms.  Patient reports that they do not desire a pregnancy in the next year.   They reported they are not interested in discussing contraception today.  Patient has a history of hysterectomy.   No LMP recorded. Patient has had a hysterectomy.   Patient has the following medical conditions:  There are no problems to display for this patient.   Chief Complaint  Patient presents with   SEXUALLY TRANSMITTED DISEASE    Screening    HPI  Patient reports to clinic today for STD screening.  Patient reports lower abdominal pain and sore throat for 3 weeks.    Last HIV test per patient/review of record was 06/2019 Patient reports last pap was 2008.   Screening for MPX risk: Does the patient have an unexplained rash? No Is the patient MSM? No Does the patient endorse multiple sex partners or anonymous sex partners? No Did the patient have close or sexual contact with a person diagnosed with MPX? No Has the patient traveled outside the [place] where MPX is endemic? No Is there a high clinical suspicion for MPX-- evidenced by one of the following No  -Unlikely to be chickenpox  -Lymphadenopathy  -Rash that present in same phase of evolution on any given body part See flowsheet for further details and programmatic requirements.   Immunization history:  Immunization History  Administered Date(s) Administered   [employer] Comirnaty(Gray [employer])Covid-19 Tri-Sucrose Vaccine 08/01/2020   Pfizer Covid-19 Vaccine Bivalent Booster 53yrs & up 03/06/2021   Tdap 05/18/2018     The following portions of the patient's history were reviewed and updated as appropriate: allergies, current medications, past medical history, past social history, past  surgical history and problem list.  Objective:  There were no vitals filed for this visit.  Physical Exam Constitutional:      Appearance: Normal appearance.  HENT:     Head: Normocephalic.     Right Ear: External ear normal.     Left Ear: External ear normal.     Nose: Nose normal.     Mouth/Throat:     Lips: Pink.     Mouth: Mucous membranes are moist.     Comments: No visible signs of dental caries  Pulmonary:     Effort: Pulmonary effort is normal.  Abdominal:     General: Abdomen is flat.     Palpations: Abdomen is soft.  Genitourinary:    Comments: External genitalia/pubic area without nits, lice, edema, erythema, lesions and inguinal adenopathy. Vagina with normal mucosa and discharge. Uterus firm, mobile, nt, no masses, no adnexal tenderness or fullness. pH 4.5. Musculoskeletal:     Cervical back: Full passive range of motion without pain, normal range of motion and neck supple.  Skin:    General: Skin is warm and dry.  Neurological:     Mental Status: She is alert and oriented to person, place, and time.  Psychiatric:        Attention and Perception: Attention normal.        Mood and Affect: Mood normal.        Speech: Speech normal.        Behavior: Behavior normal. Behavior is cooperative.     Assessment and Plan:  Lacey King is a  46 y.o. female presenting to the [facility] for STI screening  1. Screening examination for venereal disease -45 year old female in clinic today for STD screening.  -Patient accepted all screenings including, wet prep, vaginal CT/GC and bloodwork for HIV/RPR.  Patient meets criteria for HepB screening? No. Ordered? No - low risk  Patient meets criteria for HepC screening? No. Ordered? No - low risk   Treat wet prep per standing order Discussed time line for [facility] results and that patient will be called with positive results and encouraged patient to call if she had not heard in 2 weeks.   Counseled to return or seek care for continued or worsening symptoms Recommended condom use with all sex  Patient has a history of hysterectomy.     - HIV [place] - Syphilis Serology, [facility] - Chlamydia/Gonorrhea [facility] - WET PREP FOR TRICH, YEAST, CLUE  2. Depression, unspecified depression type -Patient with a history of anxiety and depression.  Behavioral health referral submitted.   - Ambulatory referral to Behavioral Health  3. Anxiety -Patient with a history of anxiety and depression.  Behavioral health referral submitted.   - Ambulatory referral to Behavioral Health   4. Yeast detected -Please treat patient today for yeast.  - clotrimazole (CLOTRIMAZOLE 3) 2 % vaginal cream; Place 1 Applicatorful vaginally at bedtime.  Dispense: 21 g; Refill: 0   Return if symptoms worsen or fail to improve.    Glenna Fellows, FNP

## 2021-06-10 NOTE — Progress Notes (Signed)
Patient here for STD screening. Wet prep reviewed, tx dispensed per providers orders.

## 2021-07-09 ENCOUNTER — Ambulatory Visit: Payer: Medicaid Other | Admitting: Licensed Clinical Social Worker
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
## 2021-07-29 ENCOUNTER — Ambulatory Visit: Payer: Medicaid Other | Admitting: Licensed Clinical Social Worker

## 2021-07-29 DIAGNOSIS — F331 Major depressive disorder, recurrent, moderate: Secondary | ICD-10-CM | POA: Insufficient documentation

## 2021-07-29 DIAGNOSIS — F1020 Alcohol dependence, uncomplicated: Secondary | ICD-10-CM | POA: Insufficient documentation

## 2021-07-29 DIAGNOSIS — F411 Generalized anxiety disorder: Secondary | ICD-10-CM | POA: Insufficient documentation
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
## 2021-08-12 ENCOUNTER — Ambulatory Visit: Payer: Medicaid Other | Admitting: Licensed Clinical Social Worker

## 2022-03-05 DIAGNOSIS — F341 Dysthymic disorder: Secondary | ICD-10-CM | POA: Diagnosis not present

## 2022-03-05 DIAGNOSIS — R739 Hyperglycemia, unspecified: Secondary | ICD-10-CM | POA: Diagnosis not present

## 2022-03-05 DIAGNOSIS — E782 Mixed hyperlipidemia: Secondary | ICD-10-CM | POA: Diagnosis not present

## 2022-03-05 DIAGNOSIS — R200 Anesthesia of skin: Secondary | ICD-10-CM | POA: Diagnosis not present

## 2022-03-05 DIAGNOSIS — M542 Cervicalgia: Secondary | ICD-10-CM | POA: Diagnosis not present

## 2022-03-05 DIAGNOSIS — I10 Essential (primary) hypertension: Secondary | ICD-10-CM | POA: Diagnosis not present

## 2022-06-04 DIAGNOSIS — R7303 Prediabetes: Secondary | ICD-10-CM | POA: Diagnosis not present

## 2022-06-04 DIAGNOSIS — I10 Essential (primary) hypertension: Secondary | ICD-10-CM | POA: Diagnosis not present
